# Patient Record
Sex: Male | Born: 1955 | Race: White | Hispanic: No | Marital: Married | Smoking: Current every day smoker
Health system: Southern US, Community
[De-identification: ages and names within clinical notes are randomized; demographics above are authoritative.]

## PROBLEM LIST (undated history)

## (undated) DIAGNOSIS — J449 Chronic obstructive pulmonary disease, unspecified: Secondary | ICD-10-CM

## (undated) DIAGNOSIS — I82409 Acute embolism and thrombosis of unspecified deep veins of unspecified lower extremity: Secondary | ICD-10-CM

## (undated) DIAGNOSIS — G8220 Paraplegia, unspecified: Secondary | ICD-10-CM

## (undated) DIAGNOSIS — I2510 Atherosclerotic heart disease of native coronary artery without angina pectoris: Secondary | ICD-10-CM

## (undated) DIAGNOSIS — J9811 Atelectasis: Principal | ICD-10-CM

## (undated) DIAGNOSIS — I482 Chronic atrial fibrillation, unspecified: Secondary | ICD-10-CM

## (undated) DIAGNOSIS — E119 Type 2 diabetes mellitus without complications: Secondary | ICD-10-CM

## (undated) DIAGNOSIS — I10 Essential (primary) hypertension: Secondary | ICD-10-CM

## (undated) DIAGNOSIS — J9621 Acute and chronic respiratory failure with hypoxia: Secondary | ICD-10-CM

## (undated) HISTORY — PX: TRACHEOSTOMY: SUR1362

## (undated) HISTORY — DX: Chronic atrial fibrillation, unspecified: I48.20

## (undated) HISTORY — DX: Type 2 diabetes mellitus without complications: E11.9

## (undated) HISTORY — DX: Acute and chronic respiratory failure with hypoxia: J96.21

## (undated) HISTORY — PX: PEG PLACEMENT: SHX5437

## (undated) HISTORY — DX: Atherosclerotic heart disease of native coronary artery without angina pectoris: I25.10

## (undated) HISTORY — DX: Chronic obstructive pulmonary disease, unspecified: J44.9

## (undated) HISTORY — DX: Paraplegia, unspecified: G82.20

## (undated) HISTORY — DX: Atelectasis: J98.11

## (undated) HISTORY — DX: Acute embolism and thrombosis of unspecified deep veins of unspecified lower extremity: I82.409

## (undated) HISTORY — DX: Essential (primary) hypertension: I10

---

## 2016-01-16 DEATH — deceased

## 2017-06-07 ENCOUNTER — Other Ambulatory Visit (HOSPITAL_COMMUNITY): Payer: Medicare Other | Admitting: Internal Medicine

## 2017-06-07 ENCOUNTER — Encounter: Payer: Self-pay | Admitting: Internal Medicine

## 2017-06-07 DIAGNOSIS — G8220 Paraplegia, unspecified: Secondary | ICD-10-CM | POA: Insufficient documentation

## 2017-06-07 DIAGNOSIS — J9811 Atelectasis: Secondary | ICD-10-CM | POA: Diagnosis not present

## 2017-06-07 DIAGNOSIS — I82409 Acute embolism and thrombosis of unspecified deep veins of unspecified lower extremity: Secondary | ICD-10-CM

## 2017-06-07 DIAGNOSIS — J449 Chronic obstructive pulmonary disease, unspecified: Secondary | ICD-10-CM | POA: Diagnosis not present

## 2017-06-07 DIAGNOSIS — J4489 Other specified chronic obstructive pulmonary disease: Secondary | ICD-10-CM

## 2017-06-07 DIAGNOSIS — J9621 Acute and chronic respiratory failure with hypoxia: Secondary | ICD-10-CM | POA: Diagnosis not present

## 2017-06-07 DIAGNOSIS — I482 Chronic atrial fibrillation, unspecified: Secondary | ICD-10-CM

## 2017-06-07 HISTORY — DX: Acute and chronic respiratory failure with hypoxia: J96.21

## 2017-06-07 HISTORY — DX: Chronic obstructive pulmonary disease, unspecified: J44.9

## 2017-06-07 HISTORY — DX: Atelectasis: J98.11
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
## 2017-06-08 ENCOUNTER — Other Ambulatory Visit (HOSPITAL_COMMUNITY): Payer: Medicare Other | Admitting: Internal Medicine

## 2017-06-08 DIAGNOSIS — J9811 Atelectasis: Secondary | ICD-10-CM | POA: Diagnosis not present

## 2017-06-08 DIAGNOSIS — I482 Chronic atrial fibrillation, unspecified: Secondary | ICD-10-CM

## 2017-06-08 DIAGNOSIS — J9621 Acute and chronic respiratory failure with hypoxia: Secondary | ICD-10-CM

## 2017-06-08 DIAGNOSIS — G8220 Paraplegia, unspecified: Secondary | ICD-10-CM

## 2017-06-08 DIAGNOSIS — J449 Chronic obstructive pulmonary disease, unspecified: Secondary | ICD-10-CM

## 2017-06-08 DIAGNOSIS — I82409 Acute embolism and thrombosis of unspecified deep veins of unspecified lower extremity: Secondary | ICD-10-CM

## 2017-06-08 DIAGNOSIS — J4489 Other specified chronic obstructive pulmonary disease: Secondary | ICD-10-CM

## 2017-06-08 NOTE — Progress Notes (Signed)
[facility]  PROGRESS NOTE  PULMONARY SERVICE ROUNDS  Date of Service: 06/08/2017  Malik Brooks  DOB: December 02, 1955  Referring physician: Larena Glassman, MD  HPI: Malik Brooks is a 62 y.o. male  being seen for Acute on Chronic Respiratory Failure.  Patient was supposed to have a bronchoscopy done today for right lower lobe atelectasis.  We prepared the patient in the usual manner and had him on 100% FiO2 on the ventilator.  Patient was given 2 mg of Versed and 50 mcg of fentanyl preprocedure.  Patient saturations were adequate prior to beginning the procedure.  The adapter was placed on the tracheostomy for the bronchoscope.  The fiberoptic scope was inserted through the adapter and even before we were reaching the carina patient saturations started to drop so the procedure was immediately aborted.  Patient was bagged and saturations did return.  I was able to catch a quick glimpse of the airway and he did have copious amounts of secretions coming from the left lung.  Unfortunately because of the desaturations we were not able to continue with the procedure.  Review of Systems: Unremarkable other than noted in HPI  Allergies:  Reviewed on the [facility]  Medications: Reviewed  Vitals: Temperature 99 pulse 100 respiratory rate 25 saturations were 98%  Ventilator Settings: Mode of ventilation pressure assist control mode FiO2 100% PEEP 15  Physical Exam: . General:  calm and comfortable NAD . Eyes: normal lids, irises & conjunctiva . ENT: grossly normal tongue not enlarged . Neck: no masses . Cardiovascular: S1 S2 Normal no rubs no gallop . Respiratory: Hoarse breath sounds noted bilaterally . Abdomen: soft non-distended . Skin: no rash seen on limited exam . Musculoskeletal:  no rigidity . Psychiatric: unable to assess . Neurologic: no involuntary movements          Lab Data and radiological Data:  Lab results were reviewed   Assessment/Plan  Patient  Active Problem List   Diagnosis Date Noted  . Acute on chronic respiratory failure with hypoxia (HCC) 06/07/2017  . COPD with chronic bronchitis (HCC) 06/07/2017  . Atelectasis, left 06/07/2017  . Chronic atrial fibrillation (HCC)   . DVT (deep venous thrombosis) (HCC)   . Paraplegia (HCC)       1. Acute on chronic respiratory failure with hypoxia patient has severe desaturations during the procedure so therefore we had to terminate the procedure without actually even really getting started.  The scope was withdrawn immediately patient was bagged and the saturations did recover. 2. Left atelectasis he obviously has mucous plugging is going to need ongoing chest PT Mucomyst possibly a chest vest might help also.  If his saturations do improve we will reconsider doing a bronchoscopy in the future. 3. Chronic atrial fibrillation rate is controlled we will continue to monitor 4. DVT stable at this time 5. Paraplegia we will continue supportive care 6. COPD severe disease we will continue to monitor   I have personally evaluated the patient, evaluated the laboratory and imaging results and formulated the assessment and plan and placed orders as needed. The Patient requires high complexity decision making for assessment and support. I have discussed the patient on rounds with the Respiratory Staff extended time of care due to desaturations time 35 minutes   Yevonne Pax, MD [facility] Pulmonary Critical Care Medicine

## 2017-06-11 ENCOUNTER — Other Ambulatory Visit (HOSPITAL_COMMUNITY): Payer: Medicare Other | Admitting: Internal Medicine

## 2017-06-11 DIAGNOSIS — J9621 Acute and chronic respiratory failure with hypoxia: Secondary | ICD-10-CM | POA: Diagnosis not present

## 2017-06-11 DIAGNOSIS — J449 Chronic obstructive pulmonary disease, unspecified: Secondary | ICD-10-CM

## 2017-06-11 DIAGNOSIS — G8220 Paraplegia, unspecified: Secondary | ICD-10-CM

## 2017-06-11 DIAGNOSIS — I482 Chronic atrial fibrillation, unspecified: Secondary | ICD-10-CM

## 2017-06-11 DIAGNOSIS — J9811 Atelectasis: Secondary | ICD-10-CM

## 2017-06-11 DIAGNOSIS — J4489 Other specified chronic obstructive pulmonary disease: Secondary | ICD-10-CM

## 2017-06-11 DIAGNOSIS — I82409 Acute embolism and thrombosis of unspecified deep veins of unspecified lower extremity: Secondary | ICD-10-CM
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
## 2017-06-12 ENCOUNTER — Other Ambulatory Visit (HOSPITAL_COMMUNITY): Payer: Medicare Other | Admitting: Internal Medicine

## 2017-06-12 DIAGNOSIS — I482 Chronic atrial fibrillation, unspecified: Secondary | ICD-10-CM

## 2017-06-12 DIAGNOSIS — J9621 Acute and chronic respiratory failure with hypoxia: Secondary | ICD-10-CM | POA: Diagnosis not present

## 2017-06-12 DIAGNOSIS — J449 Chronic obstructive pulmonary disease, unspecified: Secondary | ICD-10-CM | POA: Diagnosis not present

## 2017-06-12 DIAGNOSIS — J9811 Atelectasis: Secondary | ICD-10-CM

## 2017-06-12 DIAGNOSIS — G8220 Paraplegia, unspecified: Secondary | ICD-10-CM

## 2017-06-12 DIAGNOSIS — I82409 Acute embolism and thrombosis of unspecified deep veins of unspecified lower extremity: Secondary | ICD-10-CM

## 2017-06-12 NOTE — Progress Notes (Signed)
[facility]  PROGRESS NOTE  PULMONARY SERVICE ROUNDS  Date of Service: 06/12/2017  Malik Brooks  DOB: 18-Nov-1955  Referring physician: Larena Glassman, MD  HPI: Malik Brooks is a 62 y.o. male  being seen for Acute on Chronic Respiratory Failure.  This morning the patient had rapid response patient desaturated and also transiently lost his pulse.  Patient has had issues with oxygenation.  As noted previously he has been having issues with mucus plugging on the left side.  The patient after 1 minute of CPR regained his rhythm as well as a pulse.  Drugs were giving and apparently.  The [facility] team arrived on the scene and because he has been having some issues with mucus plugging and retained secretions we decided to transfer him to the ICU so that he can have a bronchoscopy done under control the environment.  Once this is done and he is brought back we will hopefully be able to continue with the Mucomyst as well as the chest vest to improve his secretion clearance.  Review of Systems: Unremarkable other than noted in HPI  Allergies:  Reviewed on the [facility]  Medications: Reviewed  Vitals: Temperature 97.5 pulse 90 respiratory rate 18 blood pressure 125/70 saturations 94%  Ventilator Settings: Mode of ventilation pressure assist control FiO2 45% tidal volume 390 PEEP of 10  Physical Exam: . General:  calm and comfortable NAD . Eyes: normal lids, irises & conjunctiva . ENT: grossly normal tongue not enlarged . Neck: no masses . Cardiovascular: S1 S2 Normal no rubs no gallop . Respiratory: Coarse breath sounds diminished on the left side . Abdomen: soft non-distended . Skin: no rash seen on limited exam . Musculoskeletal:  no rigidity . Psychiatric: unable to assess . Neurologic: no involuntary movements          Lab Data and radiological Data:  Data has been reviewed   Assessment/Plan  Patient Active Problem List   Diagnosis Date Noted  . Acute  on chronic respiratory failure with hypoxia (HCC) 06/07/2017  . COPD with chronic bronchitis (HCC) 06/07/2017  . Atelectasis, left 06/07/2017  . Chronic atrial fibrillation (HCC)   . DVT (deep venous thrombosis) (HCC)   . Paraplegia (HCC)       1. Acute on chronic respiratory failure with hypoxia at this time we will continue with full vent support.  Patient will be transferred to the ICU so that we can get his bronchoscopy done and then he will back to the floor after this is done.  He needs aggressive pulmonary toilet and will continue with Mucomyst and also once he is back we will add the chest vest to his regimen in addition to the in line she has secretion clearance device. 2. COPD severe disease we will continue with present management. 3. Left lower atelectasis continue aggressive pulmonary toilet supportive care 4. Chronic atrial fibrillation rate is controlled we will continue to monitor. 5. DVT stable at this time 6. Paraplegic continue present management.   I have personally evaluated the patient, evaluated the laboratory and imaging results and formulated the assessment and plan and placed orders as needed. The Patient requires high complexity decision making for assessment and support. I have discussed the patient on rounds with the Respiratory Staff time spent 35 minutes including review of data discussion with the ICU team and medical staff coordination of care   Yevonne Pax, MD [facility] Pulmonary Critical Care Medicine

## 2017-06-23 ENCOUNTER — Other Ambulatory Visit (HOSPITAL_COMMUNITY): Payer: Medicare Other | Admitting: Internal Medicine

## 2017-06-23 DIAGNOSIS — J449 Chronic obstructive pulmonary disease, unspecified: Secondary | ICD-10-CM | POA: Diagnosis not present

## 2017-06-23 DIAGNOSIS — I482 Chronic atrial fibrillation, unspecified: Secondary | ICD-10-CM

## 2017-06-23 DIAGNOSIS — J9621 Acute and chronic respiratory failure with hypoxia: Secondary | ICD-10-CM | POA: Diagnosis not present

## 2017-06-23 DIAGNOSIS — J9811 Atelectasis: Secondary | ICD-10-CM

## 2017-06-23 DIAGNOSIS — J4489 Other specified chronic obstructive pulmonary disease: Secondary | ICD-10-CM

## 2017-06-23 DIAGNOSIS — G8220 Paraplegia, unspecified: Secondary | ICD-10-CM

## 2017-06-23 DIAGNOSIS — I82409 Acute embolism and thrombosis of unspecified deep veins of unspecified lower extremity: Secondary | ICD-10-CM
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
## 2017-06-25 ENCOUNTER — Other Ambulatory Visit (HOSPITAL_COMMUNITY): Payer: Medicare Other | Admitting: Internal Medicine

## 2017-06-25 DIAGNOSIS — J9621 Acute and chronic respiratory failure with hypoxia: Secondary | ICD-10-CM | POA: Diagnosis not present

## 2017-06-25 DIAGNOSIS — J449 Chronic obstructive pulmonary disease, unspecified: Secondary | ICD-10-CM

## 2017-06-25 DIAGNOSIS — I482 Chronic atrial fibrillation, unspecified: Secondary | ICD-10-CM

## 2017-06-25 DIAGNOSIS — J9811 Atelectasis: Secondary | ICD-10-CM

## 2017-06-25 DIAGNOSIS — J4489 Other specified chronic obstructive pulmonary disease: Secondary | ICD-10-CM

## 2017-06-25 DIAGNOSIS — I82409 Acute embolism and thrombosis of unspecified deep veins of unspecified lower extremity: Secondary | ICD-10-CM

## 2017-06-25 DIAGNOSIS — G8220 Paraplegia, unspecified: Secondary | ICD-10-CM

## 2017-06-25 NOTE — Progress Notes (Signed)
[facility]  PROGRESS NOTE  PULMONARY SERVICE ROUNDS  Date of Service: 06/25/2017  Malik Brooks  DOB: 09/06/1955  Referring physician: Larena GlassmanAmir Firozvi, MD  HPI: Malik Brooks is a 62 y.o. male  being seen for Acute on Chronic Respiratory Failure.  He is on the ventilator and full support has not been tolerating pressure control mode.  Right now is on 45% oxygen.  Review of Systems: Unremarkable other than noted in HPI  Allergies:  [facility]  Medications: Reviewed  Vitals: Temperature 98.3 pulse 91 respiratory rate 18 blood pressure 130/74 saturations 93%  Ventilator Settings: Mode of ventilation pressure assist control FiO2 45% tidal volume 596 PEEP 12  Physical Exam: . General:  calm and comfortable NAD . Eyes: normal lids, irises & conjunctiva . ENT: grossly normal tongue not enlarged . Neck: no masses . Cardiovascular: S1 S2 Normal no rubs no gallop . Respiratory: No rhonchi are noted at this time. . Abdomen: soft non-distended . Skin: no rash seen on limited exam . Musculoskeletal:  no rigidity . Psychiatric: unable to assess . Neurologic: no involuntary movements          Lab Data and radiological Data:  Sodium 140 potassium 3.9 BUN 19 creatinine 0.3 White count 13.7 hemoglobin 9.4 hematocrit 31.5 platelet count 257   Assessment/Plan  Patient Active Problem List   Diagnosis Date Noted  . Acute on chronic respiratory failure with hypoxia (HCC) 06/07/2017  . COPD with chronic bronchitis (HCC) 06/07/2017  . Atelectasis, left 06/07/2017  . Chronic atrial fibrillation (HCC)   . DVT (deep venous thrombosis) (HCC)   . Paraplegia (HCC)       1. Acute on chronic respiratory failure with hypoxia remaining on full vent support currently is on pressure assist control mode patient's been requiring 45% oxygen with PEEP of 12.  Continue with aggressive pulmonary toilet supportive care. 2. COPD with chronic bronchitis advanced  disease we will continue to follow. 3. Chronic atrial fibrillation rate is controlled we will continue to follow 4. DVT at baseline 5. Paraplegia's for now 6. Atelectasis we will continue with aggressive pulmonary toilet and follow   I have personally evaluated the patient, evaluated the laboratory and imaging results and formulated the assessment and plan and placed orders as needed. The Patient requires high complexity decision making for assessment and support. I have discussed the patient on rounds with the Respiratory Staff   Yevonne PaxSaadat A Hlee Fringer, MD [facility] Pulmonary Critical Care Medicine

## 2017-06-26 ENCOUNTER — Other Ambulatory Visit (HOSPITAL_COMMUNITY): Payer: Medicare Other | Admitting: Internal Medicine

## 2017-06-26 DIAGNOSIS — J9811 Atelectasis: Secondary | ICD-10-CM | POA: Diagnosis not present

## 2017-06-26 DIAGNOSIS — J9621 Acute and chronic respiratory failure with hypoxia: Secondary | ICD-10-CM | POA: Diagnosis not present

## 2017-06-26 DIAGNOSIS — I482 Chronic atrial fibrillation, unspecified: Secondary | ICD-10-CM

## 2017-06-26 DIAGNOSIS — G8220 Paraplegia, unspecified: Secondary | ICD-10-CM

## 2017-06-26 DIAGNOSIS — I82409 Acute embolism and thrombosis of unspecified deep veins of unspecified lower extremity: Secondary | ICD-10-CM

## 2017-06-26 DIAGNOSIS — J449 Chronic obstructive pulmonary disease, unspecified: Secondary | ICD-10-CM | POA: Diagnosis not present

## 2017-06-26 DIAGNOSIS — J4489 Other specified chronic obstructive pulmonary disease: Secondary | ICD-10-CM

## 2017-06-26 NOTE — Progress Notes (Signed)
[facility]  PROGRESS NOTE  PULMONARY SERVICE ROUNDS  Date of Service: 06/26/2017  Malik Brooks  DOB: 07/23/1955  Referring physician: Larena GlassmanAmir Firozvi, MD  HPI: Malik Brooks is a 62 y.o. male  being seen for Acute on Chronic Respiratory Failure.  Patient remains on the ventilator.  Still having copious amounts of secretions.  Has not been able to come down on the oxygen has also not been able to come down on the PEEP currently is on PEEP of 12.  Apparently the family wants to take him home on the ventilator.  We discussed this on rounds to see if this will be even feasible.  At this time with his current ventilator requirements it may not be feasible  Review of Systems: Unremarkable other than noted in HPI  Allergies:  [facility]  Medications: Reviewed  Vitals: Temperature is 97.2 pulse 84 respiratory rate 18 blood pressure 126/64 saturations 93%  Ventilator Settings: Currently is on pressure assist control mode FiO2 50% tidal volume 510 PEEP of 12  Physical Exam: . General:  calm and comfortable NAD . Eyes: normal lids, irises & conjunctiva . ENT: grossly normal tongue not enlarged . Neck: no masses . Cardiovascular: S1 S2 Normal no rubs no gallop . Respiratory: Coarse breath sounds are noted at this time. . Abdomen: soft non-distended . Skin: no rash seen on limited exam . Musculoskeletal:  no rigidity . Psychiatric: unable to assess . Neurologic: no involuntary movements          Lab Data and radiological Data:  Data reviewed   Assessment/Plan  Patient Active Problem List   Diagnosis Date Noted  . Acute on chronic respiratory failure with hypoxia (HCC) 06/07/2017  . COPD with chronic bronchitis (HCC) 06/07/2017  . Atelectasis, left 06/07/2017  . Chronic atrial fibrillation (HCC)   . DVT (deep venous thrombosis) (HCC)   . Paraplegia (HCC)       1. Acute on chronic respiratory failure with hypoxia we will continue with  full vent support.  Continue with aggressive pulmonary toilet.  Patient's oxygen requirements are still significantly elevated we will try to wean the 2. Atelectasis continue aggressive pulmonary toilet 3. Chronic atrial fibrillation rate is controlled we will follow 4. DVT stable continue to monitor 5. Paraplegia functional quadriplegic we will continue supportive care prognosis guarded 6. COPD stable we will monitor   I have personally evaluated the patient, evaluated the laboratory and imaging results and formulated the assessment and plan and placed orders as needed. The Patient requires high complexity decision making for assessment and support. I have discussed the patient on rounds with the Respiratory Staff   Yevonne PaxSaadat A Khan, MD [facility] Pulmonary Critical Care Medicine

## 2017-06-28 ENCOUNTER — Other Ambulatory Visit (HOSPITAL_COMMUNITY): Payer: Medicare Other | Admitting: Internal Medicine

## 2017-06-28 DIAGNOSIS — J449 Chronic obstructive pulmonary disease, unspecified: Secondary | ICD-10-CM

## 2017-06-28 DIAGNOSIS — J9811 Atelectasis: Secondary | ICD-10-CM

## 2017-06-28 DIAGNOSIS — J9621 Acute and chronic respiratory failure with hypoxia: Secondary | ICD-10-CM

## 2017-06-28 DIAGNOSIS — I482 Chronic atrial fibrillation, unspecified: Secondary | ICD-10-CM

## 2017-06-28 DIAGNOSIS — I82409 Acute embolism and thrombosis of unspecified deep veins of unspecified lower extremity: Secondary | ICD-10-CM

## 2017-06-28 DIAGNOSIS — G8220 Paraplegia, unspecified: Secondary | ICD-10-CM

## 2017-06-28 NOTE — Progress Notes (Signed)
[facility]  PROGRESS NOTE  PULMONARY SERVICE ROUNDS  Date of Service: 06/28/2017  Malik Brooks  DOB: 01/08/1956  Referring physician: Larena GlassmanAmir Firozvi, MD  HPI: Malik DecentRay Thomas Brooks is a 62 y.o. male  being seen for Acute on Chronic Respiratory Failure.  Patient remains on the ventilator full support not able to wean.  He is awake and alert comfortable without distress.  Currently is on pressure assist control mode  Review of Systems: Unremarkable other than noted in HPI  Allergies:  Reviewed on the [facility]MAR  Medications: Reviewed  Vitals: Temperature 96.8 pulse 78 respiratory rate 18 blood pressure 116/74 saturations 90  Ventilator Settings: Mode of ventilation pressure assist control FiO2 50% tidal volume 737 PEEP 12  Physical Exam: . General:  calm and comfortable NAD . Eyes: normal lids, irises & conjunctiva . ENT: grossly normal tongue not enlarged . Neck: no masses . Cardiovascular: S1 S2 Normal no rubs no gallop . Respiratory: No rhonchi or rales . Abdomen: soft non-distended . Skin: no rash seen on limited exam . Musculoskeletal:  no rigidity . Psychiatric: unable to assess . Neurologic: no involuntary movements          Lab Data and radiological Data:  White count 8.8 hemoglobin 9 hematocrit 29.7 platelet count 261   Assessment/Plan  Patient Active Problem List   Diagnosis Date Noted  . Acute on chronic respiratory failure with hypoxia (HCC) 06/07/2017  . COPD with chronic bronchitis (HCC) 06/07/2017  . Atelectasis, left 06/07/2017  . Chronic atrial fibrillation (HCC)   . DVT (deep venous thrombosis) (HCC)   . Paraplegia (HCC)       1. Acute on chronic respiratory failure with hypoxia we will continue with pressure assist control mode continue pulmonary toilet supportive care patient is on 50% try to titrate down afebrile able to tolerate. 2. COPD severe disease we will continue with present management. 3. Chronic atrial fibrillation  rate is controlled. 4. Atelectasis chronic we will continue aggressive pulmonary toilet follow x-rays as necessary 5. DVT stable 6. Paraplegia functional quadriplegia we will continue with supportive care   I have personally evaluated the patient, evaluated the laboratory and imaging results and formulated the assessment and plan and placed orders as needed. The Patient requires high complexity decision making for assessment and support. I have discussed the patient on rounds with the Respiratory Staff   Yevonne PaxSaadat A Derika Eckles, MD [facility] Pulmonary Critical Care Medicine

## 2017-07-09 ENCOUNTER — Other Ambulatory Visit (HOSPITAL_COMMUNITY): Payer: Medicare Other | Admitting: Internal Medicine

## 2017-07-09 DIAGNOSIS — I482 Chronic atrial fibrillation, unspecified: Secondary | ICD-10-CM

## 2017-07-09 DIAGNOSIS — J449 Chronic obstructive pulmonary disease, unspecified: Secondary | ICD-10-CM | POA: Diagnosis not present

## 2017-07-09 DIAGNOSIS — J9811 Atelectasis: Secondary | ICD-10-CM

## 2017-07-09 DIAGNOSIS — J9621 Acute and chronic respiratory failure with hypoxia: Secondary | ICD-10-CM

## 2017-07-09 DIAGNOSIS — G8220 Paraplegia, unspecified: Secondary | ICD-10-CM

## 2017-07-09 NOTE — Progress Notes (Signed)
[facility]  PROGRESS NOTE  PULMONARY SERVICE ROUNDS  Date of Service: 07/09/2017  Malik Brooks  DOB: 12/18/1955  Referring physician: Larena GlassmanAmir Firozvi, MD  HPI: Malik Brooks is a 62 y.o. male  being seen for Acute on Chronic Respiratory Failure.  Patient is on pressure support has been tolerating it well apparently has been doing pressure support for 24 hours now  Review of Systems: Unremarkable other than noted in HPI  Allergies:  [facility]  Medications: Reviewed  Vitals: Temperature 97.6 pulse 107 respiratory rate 23 blood pressure 136/78 saturations 96%  Ventilator Settings: Mode of ventilation pressure support FiO2 45% tidal volume 477 pressure support 12 PEEP 8  Physical Exam: . General:  calm and comfortable NAD . Eyes: normal lids, irises & conjunctiva . ENT: grossly normal tongue not enlarged . Neck: no masses . Cardiovascular: S1 S2 Normal no rubs no gallop . Respiratory: No rhonchi or rales are noted at this time . Abdomen: soft non-distended . Skin: no rash seen on limited exam . Musculoskeletal:  no rigidity . Psychiatric: unable to assess . Neurologic: no involuntary movements          Lab Data and radiological Data:  Data has been reviewed   Assessment/Plan  Patient Active Problem List   Diagnosis Date Noted  . Acute on chronic respiratory failure with hypoxia (HCC) 06/07/2017  . COPD with chronic bronchitis (HCC) 06/07/2017  . Atelectasis, left 06/07/2017  . Chronic atrial fibrillation (HCC)   . DVT (deep venous thrombosis) (HCC)   . Paraplegia (HCC)       1. Acute on chronic respiratory failure with hypoxia patient is doing fine with weaning on pressure support spoke with respiratory therapy on rounds and will try the patient on T collar if able to tolerate. 2. COPD with chronic bronchitis stable at this time we will continue present management. 3. Atelectasis follow x-rays as necessary 4. Chronic atrial  fibrillation rate is controlled 5. Paraplegia at baseline we will continue to monitor   I have personally evaluated the patient, evaluated the laboratory and imaging results and formulated the assessment and plan and placed orders as needed. The Patient requires high complexity decision making for assessment and support. I have discussed the patient on rounds with the Respiratory Staff   Yevonne PaxSaadat A Khan, MD [facility] Pulmonary Critical Care Medicine

## 2017-07-10 ENCOUNTER — Other Ambulatory Visit (HOSPITAL_COMMUNITY): Payer: Medicare Other | Admitting: Internal Medicine

## 2017-07-10 DIAGNOSIS — J9811 Atelectasis: Secondary | ICD-10-CM

## 2017-07-10 DIAGNOSIS — J4489 Other specified chronic obstructive pulmonary disease: Secondary | ICD-10-CM

## 2017-07-10 DIAGNOSIS — J9621 Acute and chronic respiratory failure with hypoxia: Secondary | ICD-10-CM | POA: Diagnosis not present

## 2017-07-10 DIAGNOSIS — J449 Chronic obstructive pulmonary disease, unspecified: Secondary | ICD-10-CM

## 2017-07-10 DIAGNOSIS — I482 Chronic atrial fibrillation, unspecified: Secondary | ICD-10-CM

## 2017-07-10 DIAGNOSIS — G8220 Paraplegia, unspecified: Secondary | ICD-10-CM

## 2017-07-10 NOTE — Progress Notes (Signed)
[facility]  [facility]GRESS NOTE  PULMONARY SERVICE ROUNDS  Date of Service: 07/10/2017  Malik Brooks  DOB: 07/08/1955  Referring physician: Larena GlassmanAmir Firozvi, MD  HPI: Malik Brooks is a 62 y.o. male  being seen for Acute on Chronic Respiratory Failure.  Patient remains on full vent support has been on pressure assist control mode currently on 45% oxygen good saturations are noted.  Good volumes are noted.  Patient's not really able to tolerate being off the ventilator for prolonged period of time  Review of Systems: Unremarkable other than noted in HPI  Allergies:  Reviewed on the [facility]  Medications: Reviewed  Vitals: Temperature 97.8 pulse 123 respiratory rate 21 blood pressure 103/56 saturations 97%  Ventilator Settings: Mode of ventilation pressure assist control FiO2 45% tidal volume 712 PEEP 8  Physical Exam: . General:  calm and comfortable NAD . Eyes: normal lids, irises & conjunctiva . ENT: grossly normal tongue not enlarged . Neck: no masses . Cardiovascular: S1 S2 Normal no rubs no gallop . Respiratory: No rhonchi or rales are noted . Abdomen: soft non-distended . Skin: no rash seen on limited exam . Musculoskeletal:  no rigidity . Psychiatric: unable to assess . Neurologic: no involuntary movements          Lab Data and radiological Data:  Sodium 139 potassium 3.5 BUN 15 creatinine 0.5 White count 13.6 hemoglobin 8.6 hematocrit 28.9 platelet count 269   Assessment/Plan  Patient Active Problem List   Diagnosis Date Noted  . Acute on chronic respiratory failure with hypoxia (HCC) 06/07/2017  . COPD with chronic bronchitis (HCC) 06/07/2017  . Atelectasis, left 06/07/2017  . Chronic atrial fibrillation (HCC)   . DVT (deep venous thrombosis) (HCC)   . Paraplegia (HCC)       1. Acute on chronic respiratory failure with hypoxia we will continue with attempting weaning as tolerated.  Patient will be followed by weaning protocol continue  secretion management pulmonary toilet overall prognosis is guarded for full wean he will need ongoing ventilatory support. 2. COPD with chronic bronchitis we will continue present management. 3. Left sided atelectasis continue aggressive pulmonary toilet 4. Chronic atrial fibrillation rate is controlled we will follow 5. Paraplegia at baseline limiting factor as far as being able to wean   I have personally evaluated the patient, evaluated the laboratory and imaging results and formulated the assessment and plan and placed orders as needed. The Patient requires high complexity decision making for assessment and support. I have discussed the patient on rounds with the Respiratory Staff   Yevonne PaxSaadat A Khan, MD [facility] Pulmonary Critical Care Medicine

## 2017-07-14 ENCOUNTER — Other Ambulatory Visit (HOSPITAL_COMMUNITY): Payer: Medicare Other | Admitting: Internal Medicine

## 2017-07-14 DIAGNOSIS — J9811 Atelectasis: Secondary | ICD-10-CM | POA: Diagnosis not present

## 2017-07-14 DIAGNOSIS — J9621 Acute and chronic respiratory failure with hypoxia: Secondary | ICD-10-CM

## 2017-07-14 DIAGNOSIS — G8220 Paraplegia, unspecified: Secondary | ICD-10-CM

## 2017-07-14 DIAGNOSIS — I482 Chronic atrial fibrillation, unspecified: Secondary | ICD-10-CM

## 2017-07-14 DIAGNOSIS — J449 Chronic obstructive pulmonary disease, unspecified: Secondary | ICD-10-CM

## 2017-07-14 DIAGNOSIS — I82409 Acute embolism and thrombosis of unspecified deep veins of unspecified lower extremity: Secondary | ICD-10-CM

## 2017-07-14 NOTE — Progress Notes (Signed)
[facility]  [facility]GRESS NOTE  PULMONARY SERVICE ROUNDS  Date of Service: 07/14/2017  Malik Brooks Thomas Stiehl  DOB: 05/13/1955  Referring physician: Larena GlassmanAmir Firozvi, MD  HPI: Malik DecentRay Thomas Brooks is a 62 y.o. male  being seen for Acute on Chronic Respiratory Failure.  Patient is on full vent support remains currently on pressure assist control has been on 65% oxygen no distress.  Review of Systems: Unremarkable other than noted in HPI  Allergies:  Reviewed on the [facility]  Medications: Reviewed  Vitals: Temperature is 98.7 pulse 99 respiratory rate 30 blood pressure 130/70 saturations are 99%  Ventilator Settings: Mode of ventilation pressure assist control FiO2 65% tidal volume 710 PEEP 10  Physical Exam: . General:  calm and comfortable NAD . Eyes: normal lids, irises & conjunctiva . ENT: grossly normal tongue not enlarged . Neck: no masses . Cardiovascular: S1 S2 Normal no rubs no gallop . Respiratory: No rhonchi or rales are noted at this time . Abdomen: soft non-distended . Skin: no rash seen on limited exam . Musculoskeletal:  no rigidity . Psychiatric: unable to assess . Neurologic: no involuntary movements          Lab Data and radiological Data:  Data has been reviewed   Assessment/Plan  Patient Active Problem List   Diagnosis Date Noted  . Acute on chronic respiratory failure with hypoxia (HCC) 06/07/2017  . COPD with chronic bronchitis (HCC) 06/07/2017  . Atelectasis, left 06/07/2017  . Chronic atrial fibrillation (HCC)   . DVT (deep venous thrombosis) (HCC)   . Paraplegia (HCC)       1. Acute on chronic respiratory failure with hypoxia we will continue with pressure assist control continue pulmonary toilet secretion management patient's prognosis overall remains guarded. 2. COPD with chronic bronchitis stable at this time we will continue to follow 3. Left-sided atelectasis continue present therapy 4. Chronic atrial fibrillation rate is  controlled 5. DVT treated 6. Paraplegia stable we will monitor   I have personally evaluated the patient, evaluated the laboratory and imaging results and formulated the assessment and plan and placed orders as needed. The Patient requires high complexity decision making for assessment and support. I have discussed the patient on rounds with the Respiratory Staff   Yevonne PaxSaadat A Felissa Blouch, MD [facility] Pulmonary Critical Care Medicine

## 2017-07-15 ENCOUNTER — Other Ambulatory Visit (HOSPITAL_COMMUNITY): Payer: Medicare Other | Admitting: Internal Medicine

## 2017-07-15 DIAGNOSIS — I482 Chronic atrial fibrillation, unspecified: Secondary | ICD-10-CM

## 2017-07-15 DIAGNOSIS — J9621 Acute and chronic respiratory failure with hypoxia: Secondary | ICD-10-CM | POA: Diagnosis not present

## 2017-07-15 DIAGNOSIS — J9811 Atelectasis: Secondary | ICD-10-CM | POA: Diagnosis not present

## 2017-07-15 DIAGNOSIS — J449 Chronic obstructive pulmonary disease, unspecified: Secondary | ICD-10-CM

## 2017-07-15 DIAGNOSIS — G8220 Paraplegia, unspecified: Secondary | ICD-10-CM

## 2017-07-15 DIAGNOSIS — I82409 Acute embolism and thrombosis of unspecified deep veins of unspecified lower extremity: Secondary | ICD-10-CM

## 2017-07-15 NOTE — Progress Notes (Signed)
[facility]  [facility]GRESS NOTE  PULMONARY SERVICE ROUNDS  Date of Service: 07/15/2017  Malik Brooks Thomas Bonelli  DOB: 11/26/1955  Referring physician: Larena GlassmanAmir Firozvi, MD  HPI: Malik DecentRay Thomas Brooks is a 62 y.o. male  being seen for Acute on Chronic Respiratory Failure.  Patient has been doing poorly as far as being able to wean off the ventilator.  As noted previously I do not think he is going to be able to come off the ventilator.  Right now is on full support and pressure assist control mode has been on 65% oxygen with a PEEP of 10  Review of Systems: Unremarkable other than noted in HPI  Allergies:  [facility]  Medications: Reviewed  Vitals: Temperature 98.5 pulse 96 respiratory rate 17 blood pressure 120/80 saturations 96%  Ventilator Settings: Mode of ventilation pressure assist control mode 65% FiO2 PEEP of 10 tidal volume 491  Physical Exam: . General:  calm and comfortable NAD . Eyes: normal lids, irises & conjunctiva . ENT: grossly normal tongue not enlarged . Neck: no masses . Cardiovascular: S1 S2 Normal no rubs no gallop . Respiratory: Coarse breath sounds are noted bilaterally . Abdomen: soft non-distended . Skin: no rash seen on limited exam . Musculoskeletal:  no rigidity . Psychiatric: unable to assess . Neurologic: no involuntary movements          Lab Data and radiological Data:  Data has been reviewed   Assessment/Plan  Patient Active Problem List   Diagnosis Date Noted  . Acute on chronic respiratory failure with hypoxia (HCC) 06/07/2017  . COPD with chronic bronchitis (HCC) 06/07/2017  . Atelectasis, left 06/07/2017  . Chronic atrial fibrillation (HCC)   . DVT (deep venous thrombosis) (HCC)   . Paraplegia (HCC)       1. Acute on chronic respiratory failure with hypoxia we will continue with full support on pressure control as noted above patient is not going to be able to wean.  Discussed with case management I think he is  going to need placement and a skilled nursing facility for ventilators. 2. COPD with chronic bronchitis we will continue with supportive care at baseline 3. Left-sided atelectasis needs aggressive pulmonary toilet discussed with respiratory therapy the possibility of resuming the chest vest for him. 4. Chronic atrial fibrillation rate is controlled 5. DVT treated 6. Paraplegia limiting factor as far as being able to wean   I have personally evaluated the patient, evaluated the laboratory and imaging results and formulated the assessment and plan and placed orders as needed. The Patient requires high complexity decision making for assessment and support. I have discussed the patient on rounds with the Respiratory Staff   Yevonne PaxSaadat A Khan, MD [facility] Pulmonary Critical Care Medicine

## 2017-07-16 ENCOUNTER — Other Ambulatory Visit (HOSPITAL_COMMUNITY): Payer: Medicare Other | Admitting: Internal Medicine

## 2017-07-16 DIAGNOSIS — I482 Chronic atrial fibrillation, unspecified: Secondary | ICD-10-CM

## 2017-07-16 DIAGNOSIS — J9811 Atelectasis: Secondary | ICD-10-CM

## 2017-07-16 DIAGNOSIS — J449 Chronic obstructive pulmonary disease, unspecified: Secondary | ICD-10-CM | POA: Diagnosis not present

## 2017-07-16 DIAGNOSIS — J9621 Acute and chronic respiratory failure with hypoxia: Secondary | ICD-10-CM | POA: Diagnosis not present

## 2017-07-16 DIAGNOSIS — G8220 Paraplegia, unspecified: Secondary | ICD-10-CM

## 2017-07-16 NOTE — Progress Notes (Signed)
[facility]  [facility]ESS [facility]  PULMONARY SERVICE ROUNDS  Date of Service: 07/16/2017  Malik Brooks  DOB: 12/21/1955  Referring physician: Larena GlassmanAmir Firozvi, MD  HPI: Malik Brooks is a 62 y.o. male  being seen for Acute on Chronic Respiratory Failure.  Patient remains on the ventilator full support unable to be needed.  Continues with pressure assist control currently is on 65% FiO2.  Review of Systems: Unremarkable other than noted in HPI  Allergies:  [facility] the [facility]  Medications: Reviewed  Vitals: Temperature 96.3 pulse 70 respiratory 27 blood pressure is 122/58 saturations 95%  Ventilator Settings: Mode of ventilation pressure assist control FiO2 65% tidal volume 597 PEEP 8  Physical Exam: . General:  calm and comfortable NAD . Eyes: normal lids, irises & conjunctiva . ENT: grossly normal tongue not enlarged . Neck: no masses . Cardiovascular: S1 S2 Normal no rubs no gallop . Respiratory: Coarse breath sounds no rhonchi . Abdomen: soft non-distended . Skin: no rash seen on limited exam . Musculoskeletal:  no rigidity . Psychiatric: unable to assess . Neurologic: no involuntary movements          Lab Data and radiological Data:  Data has been reviewed   Assessment/Plan  Patient Active Problem List   Diagnosis Date Noted  . Acute on chronic respiratory failure with hypoxia (HCC) 06/07/2017  . COPD with chronic bronchitis (HCC) 06/07/2017  . Atelectasis, left 06/07/2017  . Chronic atrial fibrillation (HCC)   . DVT (deep venous thrombosis) (HCC)   . Paraplegia (HCC)       1. Acute on chronic respiratory failure with hypoxia we will continue full support on pressure assist control currently is comfortable without distress at this time.  Patient is not able to wean. 2. COPD with chronic bronchitis we will continue supportive care he has advanced disease. 3. Atelectasis continue aggressive pulmonary toilet supportive care 4. Chronic  atrial fibrillation rate is controlled 5. DVT stable we will continue to follow 6. Paraplegia limiting factor for weaning on this patient   I have personally evaluated the patient, evaluated the laboratory and imaging results and formulated the assessment and plan and placed orders as needed. The Patient requires high complexity decision making for assessment and support. I have discussed the patient on rounds with the Respiratory Staff   Yevonne PaxSaadat A Linwood Gullikson, MD [facility] Pulmonary Critical Care Medicine

## 2017-07-17 ENCOUNTER — Other Ambulatory Visit (HOSPITAL_COMMUNITY): Payer: Medicare Other | Admitting: Internal Medicine

## 2017-07-17 DIAGNOSIS — J449 Chronic obstructive pulmonary disease, unspecified: Secondary | ICD-10-CM | POA: Diagnosis not present

## 2017-07-17 DIAGNOSIS — J9811 Atelectasis: Secondary | ICD-10-CM | POA: Diagnosis not present

## 2017-07-17 DIAGNOSIS — J9621 Acute and chronic respiratory failure with hypoxia: Secondary | ICD-10-CM

## 2017-07-17 DIAGNOSIS — I82409 Acute embolism and thrombosis of unspecified deep veins of unspecified lower extremity: Secondary | ICD-10-CM

## 2017-07-17 DIAGNOSIS — I482 Chronic atrial fibrillation, unspecified: Secondary | ICD-10-CM

## 2017-07-17 DIAGNOSIS — G8220 Paraplegia, unspecified: Secondary | ICD-10-CM

## 2017-07-17 NOTE — Progress Notes (Signed)
[facility]  PROGRESS NOTE  PULMONARY SERVICE ROUNDS  Date of Service: 07/17/2017  Malik Decentay Thomas Brooks  DOB: 10/03/1955  Referring physician: Larena GlassmanAmir Firozvi, MD  HPI: Malik Brooks is a 62 y.o. male  being seen for Acute on Chronic Respiratory Failure.  Patient remains on the ventilator at baseline currently is on 60% oxygen for low sats noted  Review of Systems: Unremarkable other than noted in HPI  Allergies:  [facility] on the [facility]  Medications: Reviewed  Vitals: Temperature 97.9 pulse 93 respiratory 16 blood pressure 120/62 saturation 96%  Ventilator Settings: Mode of ventilation pressure assist control FiO2 60% tidal volume 673 PEEP 8  Physical Exam: . General:  calm and comfortable NAD . Eyes: normal lids, irises & conjunctiva . ENT: grossly normal tongue not enlarged . Neck: no masses . Cardiovascular: S1 S2 Normal no rubs no gallop . Respiratory: No rhonchi or rales noted . Abdomen: soft non-distended . Skin: no rash seen on limited exam . Musculoskeletal:  no rigidity . Psychiatric: unable to assess . Neurologic: no involuntary movements          Lab Data and radiological Data:  Data has been reviewed   Assessment/Plan  Patient Active Problem List   Diagnosis Date Noted  . Acute on chronic respiratory failure with hypoxia (HCC) 06/07/2017  . COPD with chronic bronchitis (HCC) 06/07/2017  . Atelectasis, left 06/07/2017  . Chronic atrial fibrillation (HCC)   . DVT (deep venous thrombosis) (HCC)   . Paraplegia (HCC)       1. Acute on chronic respiratory failure with hypoxia continue with full vent support patient is not amenable.  Try to wean FiO2 down if possible. 2. COPD severe disease we will continue present management. 3. Atelectasis continue with pulmonary toilet 4. Chronic atrial fibrillation rate is controlled 5. DVT treated 6. Paraplegia stable   I have personally evaluated the patient, evaluated the laboratory and  imaging results and formulated the assessment and plan and placed orders as needed. The Patient requires high complexity decision making for assessment and support. I have discussed the patient on rounds with the Respiratory Staff   Yevonne PaxSaadat A Laramie Gelles, MD [facility] Pulmonary Critical Care Medicine

## 2017-07-23 ENCOUNTER — Other Ambulatory Visit (HOSPITAL_COMMUNITY): Payer: Medicare Other | Admitting: Internal Medicine

## 2017-07-23 DIAGNOSIS — J9621 Acute and chronic respiratory failure with hypoxia: Secondary | ICD-10-CM

## 2017-07-23 DIAGNOSIS — I482 Chronic atrial fibrillation, unspecified: Secondary | ICD-10-CM

## 2017-07-23 DIAGNOSIS — I82409 Acute embolism and thrombosis of unspecified deep veins of unspecified lower extremity: Secondary | ICD-10-CM

## 2017-07-23 DIAGNOSIS — G8220 Paraplegia, unspecified: Secondary | ICD-10-CM

## 2017-07-23 DIAGNOSIS — J449 Chronic obstructive pulmonary disease, unspecified: Secondary | ICD-10-CM | POA: Diagnosis not present

## 2017-07-23 DIAGNOSIS — J9811 Atelectasis: Secondary | ICD-10-CM | POA: Diagnosis not present

## 2017-07-23 NOTE — Progress Notes (Signed)
[facility]  PROGRESS NOTE  PULMONARY SERVICE ROUNDS  Date of Service: 07/23/2017  Malik Brooks  DOB: 01/12/1956  Referring physician: Larena GlassmanAmir Firozvi, MD  HPI: Malik Brooks is a 62 y.o. male  being seen for Acute on Chronic Respiratory Failure.  Patient is on T collar did about 12 hours yesterday off the ventilator  Review of Systems: Unremarkable other than noted in HPI  Allergies:  [facility] the [facility]  Medications: Reviewed  Vitals: Temperature 98.0 pulse 108 respiratory rate 20 blood pressure 120/70 saturation 94%  Ventilator Settings: Off the ventilator on T collar  Physical Exam: . General:  calm and comfortable NAD . Eyes: normal lids, irises & conjunctiva . ENT: grossly normal tongue not enlarged . Neck: no masses . Cardiovascular: S1 S2 Normal no rubs no gallop . Respiratory: No rhonchi or rales . Abdomen: soft non-distended . Skin: no rash seen on limited exam . Musculoskeletal:  no rigidity . Psychiatric: unable to assess . Neurologic: no involuntary movements          Lab Data and radiological Data:  Sodium 136 potassium 3.4 BUN 18 creatinine 0.3 White count 11.2 hemoglobin 9.8 platelet count 289   Assessment/Plan  Patient Active Problem List   Diagnosis Date Noted  . Acute on chronic respiratory failure with hypoxia (HCC) 06/07/2017  . COPD with chronic bronchitis (HCC) 06/07/2017  . Atelectasis, left 06/07/2017  . Chronic atrial fibrillation (HCC)   . DVT (deep venous thrombosis) (HCC)   . Paraplegia (HCC)       1. Acute on chronic respiratory failure with hypoxia patient is back off the ventilator think it best to be doing about 12 hours off the ventilator possibly extended to 18 hours.  He seems to still fatigued fairly easily we will continue with supportive care 2. COPD severe disease we will continue present management. 3. Left-sided atelectasis continue pulmonary toilet secretion management 4. Atrial  fibrillation rate controlled 5. DVT treated we will monitor 6. Paraplegia at baseline   I have personally evaluated the patient, evaluated the laboratory and imaging results and formulated the assessment and plan and placed orders as needed. The Patient requires high complexity decision making for assessment and support. I have discussed the patient on rounds with the Respiratory Staff   Yevonne PaxSaadat A Khan, MD [facility] Pulmonary Critical Care Medicine

## 2017-07-24 ENCOUNTER — Other Ambulatory Visit (HOSPITAL_COMMUNITY): Payer: Medicare Other | Admitting: Internal Medicine

## 2017-07-24 DIAGNOSIS — J4489 Other specified chronic obstructive pulmonary disease: Secondary | ICD-10-CM

## 2017-07-24 DIAGNOSIS — I82409 Acute embolism and thrombosis of unspecified deep veins of unspecified lower extremity: Secondary | ICD-10-CM

## 2017-07-24 DIAGNOSIS — I482 Chronic atrial fibrillation, unspecified: Secondary | ICD-10-CM

## 2017-07-24 DIAGNOSIS — J9811 Atelectasis: Secondary | ICD-10-CM

## 2017-07-24 DIAGNOSIS — J9621 Acute and chronic respiratory failure with hypoxia: Secondary | ICD-10-CM | POA: Diagnosis not present

## 2017-07-24 DIAGNOSIS — J449 Chronic obstructive pulmonary disease, unspecified: Secondary | ICD-10-CM

## 2017-07-24 DIAGNOSIS — G8220 Paraplegia, unspecified: Secondary | ICD-10-CM

## 2017-07-24 NOTE — Progress Notes (Signed)
[facility]  PROGRESS NOTE  PULMONARY SERVICE ROUNDS  Date of Service: 07/24/2017  Malik Brooks  DOB: 02/13/1955  Referring physician: Larena GlassmanAmir Firozvi, MD  HPI: Malik Brooks is a 62 y.o. male  being seen for Acute on Chronic Respiratory Failure.  Patient right now is on pressure support mode has been able to do T collar for about 15 hours yesterday so we should be able to advance further.  Review of Systems: Unremarkable other than noted in HPI  Allergies:  [facility]ed on the [facility]  Medications: Reviewed  Vitals: Temperature 97.9 pulse 94 respiratory rate 20 blood pressure milligrams 60 saturations 99%  Ventilator Settings: Mode of ventilation pressure support FiO2 50% tidal volume 5 pressure support 12 PEEP 5  Physical Exam: . General:  calm and comfortable NAD . Eyes: normal lids, irises & conjunctiva . ENT: grossly normal tongue not enlarged . Neck: no masses . Cardiovascular: S1 S2 Normal no rubs no gallop . Respiratory: No rhonchi or rales . Abdomen: soft non-distended . Skin: no rash seen on limited exam . Musculoskeletal:  no rigidity . Psychiatric: unable to assess . Neurologic: no involuntary movements          Lab Data and radiological Data:  Reviewed   Assessment/Plan  Patient Active Problem List   Diagnosis Date Noted  . Acute on chronic respiratory failure with hypoxia (HCC) 06/07/2017  . COPD with chronic bronchitis (HCC) 06/07/2017  . Atelectasis, left 06/07/2017  . Chronic atrial fibrillation (HCC)   . DVT (deep venous thrombosis) (HCC)   . Paraplegia (HCC)       1. Acute on chronic respiratory failure with hypoxia we will continue with pulmonary toilet supportive care will remain on T-bar for about 14 hours daily as tolerated.  Rest on the ventilator. 2. COPD chronic bronchitis stable at this time we will continue present management. 3. Atelectasis (continue aggressive pulmonary toilet watch for worsening. 4. Tonic  atrial fibrillation rate is controlled 5. DVT stable 6. Paraplegia at baseline   I have personally evaluated the patient, evaluated the laboratory and imaging results and formulated the assessment and plan and placed orders as needed. The Patient requires high complexity decision making for assessment and support. I have discussed the patient on rounds with the Respiratory Staff   Yevonne PaxSaadat A Margart Zemanek, MD [facility] Pulmonary Critical Care Medicine

## 2017-07-25 ENCOUNTER — Other Ambulatory Visit (HOSPITAL_COMMUNITY): Payer: Medicare Other | Admitting: Internal Medicine

## 2017-07-25 DIAGNOSIS — G8220 Paraplegia, unspecified: Secondary | ICD-10-CM

## 2017-07-25 DIAGNOSIS — I82409 Acute embolism and thrombosis of unspecified deep veins of unspecified lower extremity: Secondary | ICD-10-CM

## 2017-07-25 DIAGNOSIS — I482 Chronic atrial fibrillation, unspecified: Secondary | ICD-10-CM

## 2017-07-25 DIAGNOSIS — J9621 Acute and chronic respiratory failure with hypoxia: Secondary | ICD-10-CM | POA: Diagnosis not present

## 2017-07-25 DIAGNOSIS — J9811 Atelectasis: Secondary | ICD-10-CM

## 2017-07-25 DIAGNOSIS — J449 Chronic obstructive pulmonary disease, unspecified: Secondary | ICD-10-CM

## 2017-07-25 NOTE — Progress Notes (Signed)
[facility]  PROGRESS NOTE  PULMONARY SERVICE ROUNDS  Date of Service: 07/25/2017  Salvatore Decentay Thomas Budden  DOB: 07/31/1955  Referring physician: Larena GlassmanAmir Firozvi, MD  HPI: Salvatore DecentRay Thomas Mani is a 62 y.o. male  being seen for Acute on Chronic Respiratory Failure.  He is weaning on T piece has been on 60% oxygen.  Currently is comfortable without distress  Review of Systems: Unremarkable other than noted in HPI  Allergies:  [facility]  Medications: Reviewed  Vitals: Temperature 98.1 pulse 74 respiratory 20 blood pressure 108/66 saturations 94%  Ventilator Settings: Currently off the ventilator on T piece  Physical Exam: . General:  calm and comfortable NAD . Eyes: normal lids, irises & conjunctiva . ENT: grossly normal tongue not enlarged . Neck: no masses . Cardiovascular: S1 S2 Normal no rubs no gallop . Respiratory: No rhonchi rales are noted . Abdomen: soft non-distended . Skin: no rash seen on limited exam . Musculoskeletal:  no rigidity . Psychiatric: unable to assess . Neurologic: no involuntary movements          Lab Data and radiological Data:  Labs have been reviewed   Assessment/Plan  Patient Active Problem List   Diagnosis Date Noted  . Acute on chronic respiratory failure with hypoxia (HCC) 06/07/2017  . COPD with chronic bronchitis (HCC) 06/07/2017  . Atelectasis, left 06/07/2017  . Chronic atrial fibrillation (HCC)   . DVT (deep venous thrombosis) (HCC)   . Paraplegia (HCC)       1. Acute on chronic respiratory failure with hypoxia we will continue with T bar weans during the daytime.  Awaiting word as far as his discharge plan is concerned 2. COPD severe disease we will continue present management. 3. Chronic atelectasis continue aggressive pulmonary toilet. 4. Chronic atrial fibrillation rate is controlled 5. DVT at baseline 6. Paraplegia we will continue with supportive care prognosis guarded   I have personally  evaluated the patient, evaluated the laboratory and imaging results and formulated the assessment and plan and placed orders as needed. The Patient requires high complexity decision making for assessment and support. I have discussed the patient on rounds with the Respiratory Staff   Yevonne PaxSaadat A Khan, MD [facility] Pulmonary Critical Care Medicine

## 2017-07-26 ENCOUNTER — Other Ambulatory Visit (HOSPITAL_COMMUNITY): Payer: Medicare Other | Admitting: Internal Medicine

## 2017-07-26 DIAGNOSIS — I482 Chronic atrial fibrillation, unspecified: Secondary | ICD-10-CM

## 2017-07-26 DIAGNOSIS — J9621 Acute and chronic respiratory failure with hypoxia: Secondary | ICD-10-CM | POA: Diagnosis not present

## 2017-07-26 DIAGNOSIS — I82409 Acute embolism and thrombosis of unspecified deep veins of unspecified lower extremity: Secondary | ICD-10-CM

## 2017-07-26 DIAGNOSIS — J9811 Atelectasis: Secondary | ICD-10-CM | POA: Diagnosis not present

## 2017-07-26 DIAGNOSIS — G8220 Paraplegia, unspecified: Secondary | ICD-10-CM

## 2017-07-26 DIAGNOSIS — J449 Chronic obstructive pulmonary disease, unspecified: Secondary | ICD-10-CM | POA: Diagnosis not present

## 2017-07-26 NOTE — Progress Notes (Signed)
[facility]  PROGRESS NOTE  PULMONARY SERVICE ROUNDS  Date of Service: 07/26/2017  Malik Brooks  DOB: 06/22/1955  Referring physician: Larena GlassmanAmir Firozvi, MD  HPI: Malik Brooks is a 62 y.o. male  being seen for Acute on Chronic Respiratory Failure.  Remains on the ventilator was on pressure support check the spontaneous breathing index  Review of Systems: Unremarkable other than noted in HPI  Allergies:  [facility] the [facility]  Medications: Reviewed  Vitals: Temperature 98.0 pulse 74 respiratory rate 20 blood pressure 120/70 saturations 98%  Ventilator Settings: Mode of ventilation pressure support FiO2 50% tidal volume 456.5  Physical Exam: . General:  calm and comfortable NAD . Eyes: normal lids, irises & conjunctiva . ENT: grossly normal tongue not enlarged . Neck: no masses . Cardiovascular: S1 S2 Normal no rubs no gallop . Respiratory: No rhonchi or rales are noted . Abdomen: soft non-distended . Skin: no rash seen on limited exam . Musculoskeletal:  no rigidity . Psychiatric: unable to assess . Neurologic: no involuntary movements          Lab Data and radiological Data:  No labs to review   Assessment/Plan  Patient Active Problem List   Diagnosis Date Noted  . Acute on chronic respiratory failure with hypoxia (HCC) 06/07/2017  . COPD with chronic bronchitis (HCC) 06/07/2017  . Atelectasis, left 06/07/2017  . Chronic atrial fibrillation (HCC)   . DVT (deep venous thrombosis) (HCC)   . Paraplegia (HCC)       1. Acute on chronic respiratory failure with hypoxia we will advance weaning hopefully to T collar.  Continue with supportive care. 2. COPD severe disease we will continue present management. 3. Left-sided atelectasis continue present supportive care pulmonary toilet 4. Chronic atrial fibrillation rate is controlled 5. Paraplegia stable at baseline 6. DVT treated   I have personally evaluated the patient, evaluated the  laboratory and imaging results and formulated the assessment and plan and placed orders as needed. The Patient requires high complexity decision making for assessment and support. I have discussed the patient on rounds with the Respiratory Staff   Yevonne PaxSaadat A Kayani Rapaport, MD [facility] Pulmonary Critical Care Medicine

## 2017-07-27 ENCOUNTER — Other Ambulatory Visit (HOSPITAL_COMMUNITY): Payer: Medicare Other | Admitting: Internal Medicine

## 2017-07-27 DIAGNOSIS — G8220 Paraplegia, unspecified: Secondary | ICD-10-CM

## 2017-07-27 DIAGNOSIS — J9811 Atelectasis: Secondary | ICD-10-CM | POA: Diagnosis not present

## 2017-07-27 DIAGNOSIS — I482 Chronic atrial fibrillation, unspecified: Secondary | ICD-10-CM

## 2017-07-27 DIAGNOSIS — J9621 Acute and chronic respiratory failure with hypoxia: Secondary | ICD-10-CM | POA: Diagnosis not present

## 2017-07-27 DIAGNOSIS — I82409 Acute embolism and thrombosis of unspecified deep veins of unspecified lower extremity: Secondary | ICD-10-CM

## 2017-07-27 DIAGNOSIS — J449 Chronic obstructive pulmonary disease, unspecified: Secondary | ICD-10-CM | POA: Diagnosis not present

## 2017-07-27 DIAGNOSIS — J4489 Other specified chronic obstructive pulmonary disease: Secondary | ICD-10-CM

## 2017-07-27 NOTE — Progress Notes (Signed)
[facility]  PROGRESS NOTE  PULMONARY SERVICE ROUNDS  Date of Service: 07/27/2017  Salvatore Decentay Thomas Cavallaro  DOB: 02/19/1955  Referring physician: Larena GlassmanAmir Firozvi, MD  HPI: Salvatore DecentRay Thomas Spenser is a [age] y.o. male  being seen for Acute on Chronic Respiratory Failure.  He is weaning this morning was on T collar about 50% FiO2.  Patient was also tolerating his Passy-Muir valve without difficulty.  Vocalizes no significant complaints  Review of Systems: Unremarkable other than noted in HPI  Allergies:  [facility] the [facility]  Medications: Reviewed  Vitals: Temperature 98.0 pulse 76 respiratory rate 18 blood pressure 135 and 72 saturations 96%  Ventilator Settings: Currently is off the ventilator weaning on T collar  Physical Exam: . General:  calm and comfortable NAD . Eyes: normal lids, irises & conjunctiva . ENT: grossly normal tongue not enlarged . Neck: no masses . Cardiovascular: S1 S2 Normal no rubs no gallop . Respiratory: Coarse breath sounds no rhonchi . Abdomen: soft non-distended . Skin: no rash seen on limited exam . Musculoskeletal:  no rigidity . Psychiatric: unable to assess . Neurologic: no involuntary movements          Lab Data and radiological Data:  Labs were reviewed   Assessment/Plan  Patient Active Problem List   Diagnosis Date Noted  . Acute on chronic respiratory failure with hypoxia (HCC) 06/07/2017  . COPD with chronic bronchitis (HCC) 06/07/2017  . Atelectasis, left 06/07/2017  . Chronic atrial fibrillation (HCC)   . DVT (deep venous thrombosis) (HCC)   . Paraplegia (HCC)       1. Acute on chronic respiratory failure with hypoxia we will continue with weaning on T collar.  Patient is also tolerating the PMV.  Maximum goal is about 14 hours.  Patient might be able to advance to 18 hours as he continues to do well. 2. Chronic atrial fibrillation rate is controlled we will continue pulmonary toilet supportive care. 3. COPD severe  disease we will continue to monitor 4. Chronic atelectasis continue pulmonary toilet follow x-rays. 5. DVT treated 6. Paraplegia at baseline   I have personally evaluated the patient, evaluated the laboratory and imaging results and formulated the assessment and plan and placed orders as needed. The Patient requires high complexity decision making for assessment and support. I have discussed the patient on rounds with the Respiratory Staff   Yevonne PaxSaadat A Eufelia Veno, MD [facility] Pulmonary Critical Care Medicine

## 2017-07-29 ENCOUNTER — Other Ambulatory Visit (HOSPITAL_COMMUNITY): Payer: Medicare Other | Admitting: Internal Medicine

## 2017-07-29 DIAGNOSIS — J9621 Acute and chronic respiratory failure with hypoxia: Secondary | ICD-10-CM | POA: Diagnosis not present

## 2017-07-29 DIAGNOSIS — J9811 Atelectasis: Secondary | ICD-10-CM

## 2017-07-29 DIAGNOSIS — G8220 Paraplegia, unspecified: Secondary | ICD-10-CM

## 2017-07-29 DIAGNOSIS — I482 Chronic atrial fibrillation, unspecified: Secondary | ICD-10-CM

## 2017-07-29 DIAGNOSIS — J449 Chronic obstructive pulmonary disease, unspecified: Secondary | ICD-10-CM | POA: Diagnosis not present

## 2017-07-29 DIAGNOSIS — I82409 Acute embolism and thrombosis of unspecified deep veins of unspecified lower extremity: Secondary | ICD-10-CM

## 2017-07-29 NOTE — Progress Notes (Signed)
[facility]  PROGRESS NOTE  PULMONARY SERVICE ROUNDS  Date of Service: 07/29/2017  Malik Brooks  DOB: 07/22/1955  Referring physician: Larena GlassmanAmir Firozvi, MD  HPI: Malik Brooks is a 62 y.o. male  being seen for Acute on Chronic Respiratory Failure.  Patient is scheduled for discharge in the morning.  He had some questions regarding possibility of home ventilation.  Because of his current pulmonary status with excessive secretions he is not a safe candidate to be discharged home with a trilogy ventilator.  I think at some point when he improves clinically it may be possible but at this stage she needs to continue with the plan to discharge to a skilled nursing facility.  His family was present at the bedside and they were also updated.  Review of Systems: Unremarkable other than noted in HPI  Allergies:  [facility] the [facility]  Medications: Reviewed  Vitals: Temperature 97.1 pulse 83 respiratory rate 20 blood pressure 104 60 saturations 94%  Ventilator Settings: Off the ventilator on T collar currently is on 28% FiO2 has the PMV in place and is tolerating  Physical Exam: . General:  calm and comfortable NAD . Eyes: normal lids, irises & conjunctiva . ENT: grossly normal tongue not enlarged . Neck: no masses . Cardiovascular: S1 S2 Normal no rubs no gallop . Respiratory: No rhonchi or rales are noted at this time. . Abdomen: soft non-distended . Skin: no rash seen on limited exam . Musculoskeletal:  no rigidity . Psychiatric: unable to assess . Neurologic: no involuntary movements          Lab Data and radiological Data:  No labs to report   Assessment/Plan  Patient Active Problem List   Diagnosis Date Noted  . Acute on chronic respiratory failure with hypoxia (HCC) 06/07/2017  . COPD with chronic bronchitis (HCC) 06/07/2017  . Atelectasis, left 06/07/2017  . Chronic atrial fibrillation (HCC)   . DVT (deep venous thrombosis) (HCC)   .  Paraplegia (HCC)       1. Acute on chronic respiratory failure with hypoxia we will continue with T collar weans as tolerated.  Plan on discharge in the morning to skilled nursing facility. 2. Chronic obstructive pulmonary disease with bronchitis clinically improved we will continue present management. 3. Left recurring atelectasis continue chest PT secretion management 4. Chronic atrial fibrillation rate is controlled we will follow 5. DVT stable 6. Paraplegia at baseline   I have personally evaluated the patient, evaluated the laboratory and imaging results and formulated the assessment and plan and placed orders as needed. The Patient requires high complexity decision making for assessment and support. I have discussed the patient on rounds with the Respiratory Staff   Yevonne PaxSaadat A Ketra Duchesne, MD [facility] Pulmonary Critical Care Medicine

## 2017-11-04 ENCOUNTER — Encounter (HOSPITAL_COMMUNITY): Payer: Self-pay

## 2017-11-04 ENCOUNTER — Other Ambulatory Visit: Payer: Self-pay

## 2017-11-04 ENCOUNTER — Inpatient Hospital Stay (HOSPITAL_COMMUNITY)
Admission: EM | Admit: 2017-11-04 | Discharge: 2017-11-12 | DRG: 207 | Disposition: A | Discharge status: Other Acute Inpatient Hospital | Payer: Medicare Other | Attending: Internal Medicine | Admitting: Internal Medicine

## 2017-11-04 ENCOUNTER — Emergency Department (HOSPITAL_COMMUNITY): Payer: Medicare Other

## 2017-11-04 ENCOUNTER — Inpatient Hospital Stay (HOSPITAL_COMMUNITY): Payer: Medicare Other

## 2017-11-04 DIAGNOSIS — E119 Type 2 diabetes mellitus without complications: Secondary | ICD-10-CM | POA: Diagnosis present

## 2017-11-04 DIAGNOSIS — I2510 Atherosclerotic heart disease of native coronary artery without angina pectoris: Secondary | ICD-10-CM | POA: Diagnosis not present

## 2017-11-04 DIAGNOSIS — I619 Nontraumatic intracerebral hemorrhage, unspecified: Secondary | ICD-10-CM | POA: Diagnosis present

## 2017-11-04 DIAGNOSIS — R7881 Bacteremia: Secondary | ICD-10-CM | POA: Diagnosis present

## 2017-11-04 DIAGNOSIS — T148XXA Other injury of unspecified body region, initial encounter: Secondary | ICD-10-CM

## 2017-11-04 DIAGNOSIS — J9811 Atelectasis: Secondary | ICD-10-CM | POA: Diagnosis not present

## 2017-11-04 DIAGNOSIS — J440 Chronic obstructive pulmonary disease with acute lower respiratory infection: Secondary | ICD-10-CM | POA: Diagnosis present

## 2017-11-04 DIAGNOSIS — I611 Nontraumatic intracerebral hemorrhage in hemisphere, cortical: Secondary | ICD-10-CM | POA: Diagnosis not present

## 2017-11-04 DIAGNOSIS — Y95 Nosocomial condition: Secondary | ICD-10-CM | POA: Diagnosis present

## 2017-11-04 DIAGNOSIS — E871 Hypo-osmolality and hyponatremia: Secondary | ICD-10-CM | POA: Diagnosis present

## 2017-11-04 DIAGNOSIS — R52 Pain, unspecified: Secondary | ICD-10-CM

## 2017-11-04 DIAGNOSIS — Z930 Tracheostomy status: Secondary | ICD-10-CM

## 2017-11-04 DIAGNOSIS — L8990 Pressure ulcer of unspecified site, unspecified stage: Secondary | ICD-10-CM | POA: Diagnosis present

## 2017-11-04 DIAGNOSIS — I482 Chronic atrial fibrillation, unspecified: Secondary | ICD-10-CM | POA: Diagnosis not present

## 2017-11-04 DIAGNOSIS — Z7901 Long term (current) use of anticoagulants: Secondary | ICD-10-CM

## 2017-11-04 DIAGNOSIS — D500 Iron deficiency anemia secondary to blood loss (chronic): Secondary | ICD-10-CM | POA: Diagnosis present

## 2017-11-04 DIAGNOSIS — B9562 Methicillin resistant Staphylococcus aureus infection as the cause of diseases classified elsewhere: Secondary | ICD-10-CM | POA: Diagnosis not present

## 2017-11-04 DIAGNOSIS — J9601 Acute respiratory failure with hypoxia: Secondary | ICD-10-CM

## 2017-11-04 DIAGNOSIS — T17890A Other foreign object in other parts of respiratory tract causing asphyxiation, initial encounter: Secondary | ICD-10-CM | POA: Diagnosis present

## 2017-11-04 DIAGNOSIS — I9589 Other hypotension: Secondary | ICD-10-CM | POA: Diagnosis not present

## 2017-11-04 DIAGNOSIS — F419 Anxiety disorder, unspecified: Secondary | ICD-10-CM | POA: Diagnosis present

## 2017-11-04 DIAGNOSIS — B3749 Other urogenital candidiasis: Secondary | ICD-10-CM | POA: Diagnosis present

## 2017-11-04 DIAGNOSIS — Z431 Encounter for attention to gastrostomy: Secondary | ICD-10-CM

## 2017-11-04 DIAGNOSIS — G8220 Paraplegia, unspecified: Secondary | ICD-10-CM | POA: Diagnosis present

## 2017-11-04 DIAGNOSIS — R9431 Abnormal electrocardiogram [ECG] [EKG]: Secondary | ICD-10-CM | POA: Diagnosis not present

## 2017-11-04 DIAGNOSIS — E873 Alkalosis: Secondary | ICD-10-CM | POA: Diagnosis present

## 2017-11-04 DIAGNOSIS — Z515 Encounter for palliative care: Secondary | ICD-10-CM

## 2017-11-04 DIAGNOSIS — J189 Pneumonia, unspecified organism: Secondary | ICD-10-CM | POA: Diagnosis present

## 2017-11-04 DIAGNOSIS — Z931 Gastrostomy status: Secondary | ICD-10-CM

## 2017-11-04 DIAGNOSIS — I10 Essential (primary) hypertension: Secondary | ICD-10-CM | POA: Diagnosis present

## 2017-11-04 DIAGNOSIS — B952 Enterococcus as the cause of diseases classified elsewhere: Secondary | ICD-10-CM | POA: Diagnosis present

## 2017-11-04 DIAGNOSIS — T80219S Unspecified infection due to central venous catheter, sequela: Secondary | ICD-10-CM | POA: Diagnosis not present

## 2017-11-04 DIAGNOSIS — B957 Other staphylococcus as the cause of diseases classified elsewhere: Secondary | ICD-10-CM | POA: Diagnosis present

## 2017-11-04 DIAGNOSIS — L89154 Pressure ulcer of sacral region, stage 4: Secondary | ICD-10-CM | POA: Diagnosis not present

## 2017-11-04 DIAGNOSIS — Z1621 Resistance to vancomycin: Secondary | ICD-10-CM | POA: Diagnosis present

## 2017-11-04 DIAGNOSIS — T80219A Unspecified infection due to central venous catheter, initial encounter: Secondary | ICD-10-CM | POA: Diagnosis not present

## 2017-11-04 DIAGNOSIS — J449 Chronic obstructive pulmonary disease, unspecified: Secondary | ICD-10-CM | POA: Diagnosis not present

## 2017-11-04 DIAGNOSIS — Z79899 Other long term (current) drug therapy: Secondary | ICD-10-CM

## 2017-11-04 DIAGNOSIS — J9621 Acute and chronic respiratory failure with hypoxia: Principal | ICD-10-CM | POA: Diagnosis present

## 2017-11-04 DIAGNOSIS — E785 Hyperlipidemia, unspecified: Secondary | ICD-10-CM | POA: Diagnosis present

## 2017-11-04 DIAGNOSIS — Z66 Do not resuscitate: Secondary | ICD-10-CM | POA: Diagnosis present

## 2017-11-04 DIAGNOSIS — Z6841 Body Mass Index (BMI) 40.0 and over, adult: Secondary | ICD-10-CM

## 2017-11-04 DIAGNOSIS — I469 Cardiac arrest, cause unspecified: Secondary | ICD-10-CM | POA: Diagnosis not present

## 2017-11-04 DIAGNOSIS — R001 Bradycardia, unspecified: Secondary | ICD-10-CM | POA: Diagnosis present

## 2017-11-04 DIAGNOSIS — J9690 Respiratory failure, unspecified, unspecified whether with hypoxia or hypercapnia: Secondary | ICD-10-CM

## 2017-11-04 DIAGNOSIS — S24102S Unspecified injury at T2-T6 level of thoracic spinal cord, sequela: Secondary | ICD-10-CM | POA: Diagnosis not present

## 2017-11-04 DIAGNOSIS — J9611 Chronic respiratory failure with hypoxia: Secondary | ICD-10-CM | POA: Diagnosis not present

## 2017-11-04 DIAGNOSIS — N179 Acute kidney failure, unspecified: Secondary | ICD-10-CM | POA: Diagnosis not present

## 2017-11-04 DIAGNOSIS — I82409 Acute embolism and thrombosis of unspecified deep veins of unspecified lower extremity: Secondary | ICD-10-CM | POA: Diagnosis not present

## 2017-11-04 DIAGNOSIS — I468 Cardiac arrest due to other underlying condition: Secondary | ICD-10-CM | POA: Diagnosis present

## 2017-11-04 DIAGNOSIS — N390 Urinary tract infection, site not specified: Secondary | ICD-10-CM | POA: Diagnosis not present

## 2017-11-04 DIAGNOSIS — F17200 Nicotine dependence, unspecified, uncomplicated: Secondary | ICD-10-CM | POA: Diagnosis present

## 2017-11-04 DIAGNOSIS — Z9911 Dependence on respirator [ventilator] status: Secondary | ICD-10-CM | POA: Diagnosis not present

## 2017-11-04 DIAGNOSIS — Z86718 Personal history of other venous thrombosis and embolism: Secondary | ICD-10-CM

## 2017-11-04 DIAGNOSIS — T83511A Infection and inflammatory reaction due to indwelling urethral catheter, initial encounter: Secondary | ICD-10-CM | POA: Diagnosis not present

## 2017-11-04 LAB — CBC WITH DIFFERENTIAL/PLATELET
ABS IMMATURE GRANULOCYTES: 0.5 10*3/uL — AB (ref 0.00–0.07)
BASOS PCT: 0 %
Basophils Absolute: 0 10*3/uL (ref 0.0–0.1)
EOS PCT: 0 %
Eosinophils Absolute: 0.1 10*3/uL (ref 0.0–0.5)
HCT: 20.5 % — ABNORMAL LOW (ref 39.0–52.0)
HEMOGLOBIN: 6.2 g/dL — AB (ref 13.0–17.0)
Immature Granulocytes: 2 %
Lymphocytes Relative: 4 %
Lymphs Abs: 0.7 10*3/uL (ref 0.7–4.0)
MCH: 23.7 pg — AB (ref 26.0–34.0)
MCHC: 30.2 g/dL (ref 30.0–36.0)
MCV: 78.2 fL — AB (ref 80.0–100.0)
MONO ABS: 0.4 10*3/uL (ref 0.1–1.0)
Monocytes Relative: 2 %
Neutro Abs: 19 10*3/uL — ABNORMAL HIGH (ref 1.7–7.7)
Neutrophils Relative %: 92 %
PLATELETS: 288 10*3/uL (ref 150–400)
RBC: 2.62 MIL/uL — AB (ref 4.22–5.81)
RDW: 19.8 % — ABNORMAL HIGH (ref 11.5–15.5)
WBC: 20.8 10*3/uL — AB (ref 4.0–10.5)
nRBC: 0 % (ref 0.0–0.2)

## 2017-11-04 LAB — I-STAT CHEM 8, ED
BUN: 112 mg/dL — ABNORMAL HIGH (ref 8–23)
CHLORIDE: 95 mmol/L — AB (ref 98–111)
CREATININE: 5.2 mg/dL — AB (ref 0.61–1.24)
Calcium, Ion: 0.79 mmol/L — CL (ref 1.15–1.40)
GLUCOSE: 203 mg/dL — AB (ref 70–99)
HCT: 17 % — ABNORMAL LOW (ref 39.0–52.0)
Hemoglobin: 5.8 g/dL — CL (ref 13.0–17.0)
POTASSIUM: 4.9 mmol/L (ref 3.5–5.1)
SODIUM: 126 mmol/L — AB (ref 135–145)
TCO2: 17 mmol/L — ABNORMAL LOW (ref 22–32)

## 2017-11-04 LAB — COMPREHENSIVE METABOLIC PANEL
ALT: 5 U/L (ref 0–44)
AST: 11 U/L — AB (ref 15–41)
Albumin: 1.3 g/dL — ABNORMAL LOW (ref 3.5–5.0)
Alkaline Phosphatase: 70 U/L (ref 38–126)
Anion gap: 12 (ref 5–15)
BILIRUBIN TOTAL: 0.6 mg/dL (ref 0.3–1.2)
BUN: 113 mg/dL — AB (ref 8–23)
CALCIUM: 6.1 mg/dL — AB (ref 8.9–10.3)
CO2: 18 mmol/L — ABNORMAL LOW (ref 22–32)
CREATININE: 4.62 mg/dL — AB (ref 0.61–1.24)
Chloride: 96 mmol/L — ABNORMAL LOW (ref 98–111)
GFR calc Af Amer: 14 mL/min — ABNORMAL LOW (ref >60)
GFR, EST NON AFRICAN AMERICAN: 12 mL/min — AB (ref >60)
Glucose, Bld: 206 mg/dL — ABNORMAL HIGH (ref 70–99)
Potassium: 4.9 mmol/L (ref 3.5–5.1)
Sodium: 126 mmol/L — ABNORMAL LOW (ref 135–145)
TOTAL PROTEIN: 4.6 g/dL — AB (ref 6.5–8.1)

## 2017-11-04 LAB — I-STAT TROPONIN, ED: TROPONIN I, POC: 0.05 ng/mL (ref 0.00–0.08)

## 2017-11-04 LAB — PROTIME-INR
INR: 1.96
PROTHROMBIN TIME: 22 s — AB (ref 11.4–15.2)

## 2017-11-04 LAB — I-STAT ARTERIAL BLOOD GAS, ED
ACID-BASE DEFICIT: 4 mmol/L — AB (ref 0.0–2.0)
BICARBONATE: 21 mmol/L (ref 20.0–28.0)
O2 SAT: 100 %
PCO2 ART: 37.1 mmHg (ref 32.0–48.0)
PO2 ART: 238 mmHg — AB (ref 83.0–108.0)
Patient temperature: 98.7
TCO2: 22 mmol/L (ref 22–32)
pH, Arterial: 7.361 (ref 7.350–7.450)

## 2017-11-04 LAB — PHOSPHORUS: Phosphorus: 6.5 mg/dL — ABNORMAL HIGH (ref 2.5–4.6)

## 2017-11-04 LAB — GLUCOSE, CAPILLARY: Glucose-Capillary: 129 mg/dL — ABNORMAL HIGH (ref 70–99)

## 2017-11-04 LAB — PROCALCITONIN: PROCALCITONIN: 1.41 ng/mL

## 2017-11-04 LAB — ABO/RH: ABO/RH(D): O POS

## 2017-11-04 LAB — HEMOGLOBIN AND HEMATOCRIT, BLOOD
HEMATOCRIT: 26.6 % — AB (ref 39.0–52.0)
Hemoglobin: 8.8 g/dL — ABNORMAL LOW (ref 13.0–17.0)

## 2017-11-04 LAB — I-STAT CG4 LACTIC ACID, ED: LACTIC ACID, VENOUS: 1.78 mmol/L (ref 0.5–1.9)

## 2017-11-04 LAB — MAGNESIUM: MAGNESIUM: 2.2 mg/dL (ref 1.7–2.4)

## 2017-11-04 MED ORDER — IPRATROPIUM-ALBUTEROL 0.5-2.5 (3) MG/3ML IN SOLN
3.0000 mL | Freq: Four times a day (QID) | RESPIRATORY_TRACT | Status: DC
  Administered 2017-11-04 – 2017-11-12 (×33): 3 mL via RESPIRATORY_TRACT
  Filled 2017-11-04 (×34): qty 3

## 2017-11-04 MED ORDER — SODIUM CHLORIDE 0.9 % IV SOLN
2.0000 g | Freq: Once | INTRAVENOUS | Status: AC
  Administered 2017-11-04: 2 g via INTRAVENOUS
  Filled 2017-11-04: qty 2

## 2017-11-04 MED ORDER — VANCOMYCIN HCL 10 G IV SOLR
2500.0000 mg | Freq: Once | INTRAVENOUS | Status: AC
  Administered 2017-11-04: 2500 mg via INTRAVENOUS
  Filled 2017-11-04: qty 2500

## 2017-11-04 MED ORDER — IPRATROPIUM-ALBUTEROL 0.5-2.5 (3) MG/3ML IN SOLN
RESPIRATORY_TRACT | Status: AC
  Administered 2017-11-04: 3 mL via RESPIRATORY_TRACT
  Filled 2017-11-04: qty 3

## 2017-11-04 MED ORDER — FENTANYL CITRATE (PF) 100 MCG/2ML IJ SOLN
100.0000 ug | INTRAMUSCULAR | Status: DC | PRN
  Administered 2017-11-05 – 2017-11-08 (×9): 100 ug via INTRAVENOUS
  Filled 2017-11-04 (×9): qty 2

## 2017-11-04 MED ORDER — SODIUM CHLORIDE 0.9 % IV SOLN
250.0000 mL | INTRAVENOUS | Status: DC
  Administered 2017-11-04: 250 mL via INTRAVENOUS

## 2017-11-04 MED ORDER — CHLORHEXIDINE GLUCONATE 0.12% ORAL RINSE (MEDLINE KIT)
15.0000 mL | Freq: Two times a day (BID) | OROMUCOSAL | Status: DC
  Administered 2017-11-04 – 2017-11-12 (×14): 15 mL via OROMUCOSAL

## 2017-11-04 MED ORDER — FAMOTIDINE IN NACL 20-0.9 MG/50ML-% IV SOLN
20.0000 mg | Freq: Two times a day (BID) | INTRAVENOUS | Status: DC
  Filled 2017-11-04: qty 50

## 2017-11-04 MED ORDER — FAMOTIDINE IN NACL 20-0.9 MG/50ML-% IV SOLN
20.0000 mg | INTRAVENOUS | Status: DC
  Administered 2017-11-04 – 2017-11-06 (×3): 20 mg via INTRAVENOUS
  Filled 2017-11-04 (×4): qty 50

## 2017-11-04 MED ORDER — VANCOMYCIN VARIABLE DOSE PER UNSTABLE RENAL FUNCTION (PHARMACIST DOSING): Status: DC

## 2017-11-04 MED ORDER — NOREPINEPHRINE 4 MG/250ML-% IV SOLN
0.0000 ug/min | Freq: Once | INTRAVENOUS | Status: AC
  Administered 2017-11-04: 10 ug/min via INTRAVENOUS

## 2017-11-04 MED ORDER — INSULIN ASPART 100 UNIT/ML ~~LOC~~ SOLN
0.0000 [IU] | SUBCUTANEOUS | Status: DC
  Administered 2017-11-04 – 2017-11-11 (×14): 2 [IU] via SUBCUTANEOUS
  Administered 2017-11-12: 3 [IU] via SUBCUTANEOUS
  Administered 2017-11-12 (×3): 2 [IU] via SUBCUTANEOUS

## 2017-11-04 MED ORDER — SODIUM CHLORIDE 0.9 % IV SOLN
1.0000 g | INTRAVENOUS | Status: DC
  Administered 2017-11-05 – 2017-11-08 (×4): 1 g via INTRAVENOUS
  Filled 2017-11-04 (×5): qty 1

## 2017-11-04 MED ORDER — NOREPINEPHRINE 4 MG/250ML-% IV SOLN
5.0000 ug/min | INTRAVENOUS | Status: DC
  Administered 2017-11-04: 8 ug/min via INTRAVENOUS
  Administered 2017-11-04: 10 ug/min via INTRAVENOUS
  Administered 2017-11-05: 4 ug/min via INTRAVENOUS
  Administered 2017-11-06: 6 ug/min via INTRAVENOUS
  Administered 2017-11-06: 8 ug/min via INTRAVENOUS
  Administered 2017-11-07: 7 ug/min via INTRAVENOUS
  Filled 2017-11-04 (×6): qty 250

## 2017-11-04 MED ORDER — VANCOMYCIN HCL 10 G IV SOLR: 1750.0000 mg | INTRAVENOUS | Status: DC

## 2017-11-04 MED ORDER — SODIUM CHLORIDE 0.9% IV SOLUTION: Freq: Once | INTRAVENOUS | Status: DC

## 2017-11-04 MED ORDER — ORAL CARE MOUTH RINSE
15.0000 mL | OROMUCOSAL | Status: DC
  Administered 2017-11-04 – 2017-11-12 (×54): 15 mL via OROMUCOSAL

## 2017-11-04 MED ORDER — FENTANYL CITRATE (PF) 100 MCG/2ML IJ SOLN
100.0000 ug | INTRAMUSCULAR | Status: DC | PRN
  Administered 2017-11-06: 100 ug via INTRAVENOUS
  Filled 2017-11-04 (×2): qty 2

## 2017-11-04 MED ORDER — SODIUM CHLORIDE 0.9 % IV SOLN
INTRAVENOUS | Status: DC
  Administered 2017-11-04 – 2017-11-05 (×3): via INTRAVENOUS
  Administered 2017-11-05: 100 mL/h via INTRAVENOUS
  Administered 2017-11-06 – 2017-11-12 (×5): via INTRAVENOUS

## 2017-11-04 NOTE — H&P (Signed)
NAME:  Malik Brooks, MRN:  [id number], DOB:  Jan 06, 1956, LOS: 0 ADMISSION DATE:  11/04/2017, CONSULTATION DATE:  11/04/2017 REFERRING MD:  Francene Castle Madilyn Hook, CHIEF COMPLAINT:  Cardiac arrest   Brief History   Patient is a 62 year old male [facility] resident with a chronic trach that had some respiratory difficulties in [facility] and suffered a cardiac arrest.  Patient was DNR but received CPR, amiodarone, 2 rounds of epi and a shock with total downtime of 15 minutes. Evidently patient has been going to the urologist for chronic hematuria and requiring frequent transfusion.  In [facility], it was felt that the arrest was a respiratory code and patient was resuscitated despite of being DNR.  Patient evidently is vent dependent in [facility] after a prolonged illness.  Also has history of COPD on inhaled steroids and bronchodilators and has had significant mucous plugging and has required frequent bronchoscopies in the past.  Past Medical History   Significant Hospital Events   10/21 cardiac arrest presumed from a cardiac source.  Consults: date of consult/date signed off & final recs:    Procedures (surgical and bedside):  CPR 10/21  Significant Diagnostic Tests:  Head CT 10/21>>>  Micro Data:  Blood 10/21>>> Urine 10/21>>> Sputum 10/21>>>  Antimicrobials:  Cefepime 10/21>>> Vancoycin 10/21>>>   Subjective:  N/A  Objective   Blood pressure (!) 104/51, pulse (!) 47, temperature (!) 96.1 F (35.6 C), resp. rate 20, height 6' (1.829 m), SpO2 100 %.    Vent Mode: PCV FiO2 (%):  [100 %] 100 % Set Rate:  [20 bmp] 20 bmp PEEP:  [5 cmH20] 5 cmH20 Plateau Pressure:  [30 cmH20] 30 cmH20  No intake or output data in the 24 hours ending 11/04/17 1459 There were no vitals filed for this visit.  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  Patient was DNR and suffered a cardiac arrest and was resuscitated and sent to [facility].  Discussed with PCCM-NP and EDP..  Cardiac arrest: presumed from a respiratory source  - Tele monitoring  - No anti-coagulation given hematuria  - No need for cardiology consult for now  - Given bleeding and level severe debility and DNR status will not offer TTM protocol  - EKG  Chronic respiratory failure:   - Maintain on full vent support  - ABG and adjust vent to ABG  - Will not change trach for now  - Titrate O2 for sat of 88-92%  - Will defer bronch for now  Acute renal failure:  - Hydrate  - BMET in AM  - Replace electrolytes as indicated  Anoxic injury:  - EEG  - CT of the head  - If CT is negative then will consider MRI  - Neurology not called yet  GOC: DNR status, no further escalation of care to include increasing pressors or any other invasive procedure unless  significant neurologic recovery, will re-evaluate in 24 hours, if no improvement then proceed with comfort care.  This was discussed with the niece.  Disposition / Summary of Today's Plan 11/04/17   As above   Labs   CBC: Recent Labs  Lab 11/04/17 1330 11/04/17 1332  WBC 20.8*  --   NEUTROABS 19.0*  --   HGB 6.2* 5.8*  HCT 20.5* 17.0*  MCV 78.2*  --   PLT 288  --     Basic Metabolic Panel: Recent Labs  Lab 11/04/17 1330 11/04/17 1332  NA 126* 126*  K 4.9 4.9  CL 96* 95*  CO2 18*  --   GLUCOSE 206* 203*    BUN 113* 112*  CREATININE 4.62* 5.20*  CALCIUM 6.1*  --    GFR: CrCl cannot be calculated (Unknown ideal weight.). Recent Labs  Lab 11/04/17 1330 11/04/17 1349  WBC 20.8*  --   LATICACIDVEN  --  1.78    Liver Function Tests: Recent Labs  Lab 11/04/17 1330  AST 11*  ALT 5  ALKPHOS 70  BILITOT 0.6  PROT 4.6*  ALBUMIN 1.3*   No results for input(s): LIPASE, AMYLASE in the last 168 hours. No results for input(s): AMMONIA in the last 168 hours.  ABG    Component Value Date/Time   PHART 7.361 11/04/2017 1415   PCO2ART 37.1 11/04/2017 1415   PO2ART 238.0 (H) 11/04/2017 1415   HCO3 21.0 11/04/2017 1415   TCO2 22 11/04/2017 1415   ACIDBASEDEF 4.0 (H) 11/04/2017 1415   O2SAT 100.0 11/04/2017 1415     Coagulation Profile: Recent Labs  Lab 11/04/17 1330  INR 1.96    Cardiac Enzymes: No results for input(s): CKTOTAL, CKMB, CKMBINDEX, TROPONINI in the last 168 hours.  HbA1C: No results found for: HGBA1C  CBG: No results for input(s): GLUCAP in the last 168 hours.  Admitting History of Present Illness.     Review of Systems:   Unattainable  Past Medical History  He,  has a past medical history of Acute on chronic respiratory failure with hypoxia (HCC) (06/07/2017), Atelectasis, left (06/07/2017), Chronic atrial fibrillation, COPD with chronic bronchitis (HCC) (06/07/2017), Coronary artery disease, Diabetes mellitus (HCC), DVT (deep venous thrombosis) (HCC), Hypertension, and Paraplegia (HCC).   Surgical History    Past Surgical History:  Procedure Laterality Date  . PEG PLACEMENT    . TRACHEOSTOMY       Social History   Social History   Socioeconomic History  . Marital status: Married    Spouse name: Not on file  . Number of children: Not on file  . Years of education: Not on file  . Highest education level: Not on file  Occupational History  . Not on file  Social Needs  . Financial resource strain: Not on file  . Food insecurity:    Worry: Not  on file    Inability: Not on file  . Transportation needs:    Medical: Not on file    Non-medical: Not on file  Tobacco Use  . Smoking status: Current Every Day Smoker  . Smokeless tobacco: Never Used  Substance and Sexual Activity  . Alcohol use: Not Currently  . Drug use: Not on file  . Sexual activity: Not Currently  Lifestyle  . Physical activity:    Days per week: Not on file    Minutes per session: Not on file  . Stress: Not on file  Relationships  . Social connections:    Talks on phone:  Not on file    Gets together: Not on file    Attends religious service: Not on file    Active member of club or organization: Not on file    Attends meetings of clubs or organizations: Not on file    Relationship status: Not on file  . Intimate partner violence:    Fear of current or ex partner: Not on file    Emotionally abused: Not on file    Physically abused: Not on file    Forced sexual activity: Not on file  Other Topics Concern  . Not on file  Social History Narrative  . Not on file  ,  reports that he has been smoking. He has never used smokeless tobacco. He reports that he drank alcohol.   Family History   His Family history is unknown by patient.   Allergies Not on File   Home Medications  Prior to Admission medications   Not on File     Critical care time: 43    The patient is critically ill with multiple organ systems failure and requires high complexity decision making for assessment and support, frequent evaluation and titration of therapies, application of advanced monitoring technologies and extensive interpretation of multiple databases.   Critical Care Time devoted to patient care services described in this note is  45  Minutes. This time reflects time of care of this signee Dr Koren Bound. This critical care time does not reflect procedure time, or teaching time or supervisory time of PA/NP/Med student/Med Resident etc but could involve care discussion  time.  Alyson Reedy, M.D. [facility] Pulmonary/Critical Care Medicine. Pager: [phone]. After hours pager: [phone].
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
## 2017-11-04 NOTE — Progress Notes (Signed)
Patient was transported to CT and back to 2M07 on ventilator with no complications.

## 2017-11-04 NOTE — Progress Notes (Signed)
[facility]Link Physician-Brief Progress Note Patient Name: Malik Brooks DOB: 01/01/1956 MRN: [id number]   Date of Service  11/04/2017  HPI/Events of Note  Head CT with parenchymal bleed. INR 1.98  eICU Interventions  Transfuse 2 units FFP.  Discussed with bedside nurse, family to be informed.     Intervention Category Intermediate Interventions: Bleeding - evaluation and treatment with blood products  Darl Pikes 11/04/2017, 8:42 PM

## 2017-11-04 NOTE — ED Triage Notes (Signed)
Pt brought in by [facility] from [facility] due to pt having a cardiac arrest. Per [facility], pt was given 2 pis, 300mg  of amio, and shocked x1. Pt in normal sinus at this time. Pt has levophed going at 6. Pt has left arm PICC double lumen. Pt received approximately 15 minutes of CPR. Pt has trach.

## 2017-11-04 NOTE — Progress Notes (Addendum)
Pharmacy Antibiotic Note  Malik Brooks is a 62 y.o. male admitted on 11/04/2017 with pneumonia.  Pharmacy has been consulted for vancomycin dosing. Pt is hypothermic and WBC is elevated at 20.8. SCr is well above baseline.   Plan: Vancomycin 2500mg  IV x 1 F/u SCr trend for further doses F/u renal fxn, C&S, clinical status and trough at [facility] F/u continuation of cefepime or other gram neg coverage  Height: 6' (182.9 cm) IBW/kg (Calculated) : 77.6  Temp (24hrs), Avg:96.2 F (35.7 C), Min:95.8 F (35.4 C), Max:96.5 F (35.8 C)  Recent Labs  Lab 11/04/17 1330 11/04/17 1332 11/04/17 1349  WBC 20.8*  --   --   CREATININE  --  5.20*  --   LATICACIDVEN  --   --  1.78    CrCl cannot be calculated (Unknown ideal weight.).    Not on File  Antimicrobials this admission: Vanc 10/21>> Cefepime x 1 10/21  Dose adjustments this admission: N/A  Microbiology results: Pending  Thank you for allowing pharmacy to be a part of this patient's care.  Rumbarger, Drake Leach 11/04/2017 2:22 PM    Addendum -SCr 5.2, eCrCl ~ 15 ml/min -Cefepime 1 g IV q24h -Vancomycin 1750 mg IV q48h -Follow renal fx, cultures, VR as needed  [name] 11/04/2017 3:55 PM

## 2017-11-04 NOTE — Progress Notes (Signed)
Pt transported on vent from ED to 2M07 without complications.  Unit RT at bedside.

## 2017-11-05 ENCOUNTER — Inpatient Hospital Stay (HOSPITAL_COMMUNITY): Payer: Medicare Other

## 2017-11-05 DIAGNOSIS — L89154 Pressure ulcer of sacral region, stage 4: Secondary | ICD-10-CM

## 2017-11-05 DIAGNOSIS — R9431 Abnormal electrocardiogram [ECG] [EKG]: Secondary | ICD-10-CM

## 2017-11-05 DIAGNOSIS — L8990 Pressure ulcer of unspecified site, unspecified stage: Secondary | ICD-10-CM | POA: Diagnosis present

## 2017-11-05 LAB — BLOOD CULTURE ID PANEL (REFLEXED)
ACINETOBACTER BAUMANNII: NOT DETECTED
CANDIDA KRUSEI: NOT DETECTED
CANDIDA PARAPSILOSIS: NOT DETECTED
CANDIDA TROPICALIS: NOT DETECTED
Candida albicans: NOT DETECTED
Candida glabrata: NOT DETECTED
ESCHERICHIA COLI: NOT DETECTED
Enterobacter cloacae complex: NOT DETECTED
Enterobacteriaceae species: NOT DETECTED
Enterococcus species: NOT DETECTED
HAEMOPHILUS INFLUENZAE: NOT DETECTED
KLEBSIELLA OXYTOCA: NOT DETECTED
KLEBSIELLA PNEUMONIAE: NOT DETECTED
Listeria monocytogenes: NOT DETECTED
METHICILLIN RESISTANCE: DETECTED — AB
Neisseria meningitidis: NOT DETECTED
PROTEUS SPECIES: NOT DETECTED
Pseudomonas aeruginosa: NOT DETECTED
SERRATIA MARCESCENS: NOT DETECTED
STAPHYLOCOCCUS AUREUS BCID: NOT DETECTED
STAPHYLOCOCCUS SPECIES: DETECTED — AB
Streptococcus agalactiae: NOT DETECTED
Streptococcus pneumoniae: NOT DETECTED
Streptococcus pyogenes: NOT DETECTED
Streptococcus species: NOT DETECTED

## 2017-11-05 LAB — CBC
HCT: 20.2 % — ABNORMAL LOW (ref 39.0–52.0)
HEMATOCRIT: 26.6 % — AB (ref 39.0–52.0)
Hemoglobin: 6.6 g/dL — CL (ref 13.0–17.0)
Hemoglobin: 8.5 g/dL — ABNORMAL LOW (ref 13.0–17.0)
MCH: 24.5 pg — AB (ref 26.0–34.0)
MCH: 25.1 pg — ABNORMAL LOW (ref 26.0–34.0)
MCHC: 32.7 g/dL (ref 30.0–36.0)
MCHC: 32 g/dL (ref 30.0–36.0)
MCV: 75.1 fL — ABNORMAL LOW (ref 80.0–100.0)
MCV: 78.5 fL — ABNORMAL LOW (ref 80.0–100.0)
NRBC: 0 % (ref 0.0–0.2)
PLATELETS: 232 10*3/uL (ref 150–400)
Platelets: 233 10*3/uL (ref 150–400)
RBC: 2.69 MIL/uL — AB (ref 4.22–5.81)
RBC: 3.39 MIL/uL — AB (ref 4.22–5.81)
RDW: 19.5 % — AB (ref 11.5–15.5)
RDW: 19 % — ABNORMAL HIGH (ref 11.5–15.5)
WBC: 13.7 10*3/uL — ABNORMAL HIGH (ref 4.0–10.5)
WBC: 11.7 10*3/uL — AB (ref 4.0–10.5)
nRBC: 0 % (ref 0.0–0.2)

## 2017-11-05 LAB — GLUCOSE, CAPILLARY
GLUCOSE-CAPILLARY: 156 mg/dL — AB (ref 70–99)
GLUCOSE-CAPILLARY: 95 mg/dL (ref 70–99)
GLUCOSE-CAPILLARY: 100 mg/dL — AB (ref 70–99)
GLUCOSE-CAPILLARY: 97 mg/dL (ref 70–99)
Glucose-Capillary: 101 mg/dL — ABNORMAL HIGH (ref 70–99)
Glucose-Capillary: 102 mg/dL — ABNORMAL HIGH (ref 70–99)
Glucose-Capillary: 111 mg/dL — ABNORMAL HIGH (ref 70–99)

## 2017-11-05 LAB — PREPARE FRESH FROZEN PLASMA
UNIT DIVISION: 0
Unit division: 0

## 2017-11-05 LAB — BASIC METABOLIC PANEL
ANION GAP: 14 (ref 5–15)
Anion gap: 14 (ref 5–15)
BUN: 109 mg/dL — ABNORMAL HIGH (ref 8–23)
BUN: 124 mg/dL — AB (ref 8–23)
CALCIUM: 6.3 mg/dL — AB (ref 8.9–10.3)
CHLORIDE: 100 mmol/L (ref 98–111)
CHLORIDE: 96 mmol/L — AB (ref 98–111)
CO2: 16 mmol/L — ABNORMAL LOW (ref 22–32)
CO2: 21 mmol/L — ABNORMAL LOW (ref 22–32)
CREATININE: 4.31 mg/dL — AB (ref 0.61–1.24)
Calcium: 7.4 mg/dL — ABNORMAL LOW (ref 8.9–10.3)
Creatinine, Ser: 5.09 mg/dL — ABNORMAL HIGH (ref 0.61–1.24)
GFR, EST AFRICAN AMERICAN: 15 mL/min — AB (ref >60)
GFR, EST AFRICAN AMERICAN: 13 mL/min — AB (ref >60)
GFR, EST NON AFRICAN AMERICAN: 13 mL/min — AB (ref >60)
GFR, EST NON AFRICAN AMERICAN: 11 mL/min — AB (ref >60)
Glucose, Bld: 121 mg/dL — ABNORMAL HIGH (ref 70–99)
Glucose, Bld: 112 mg/dL — ABNORMAL HIGH (ref 70–99)
Potassium: 3.8 mmol/L (ref 3.5–5.1)
Potassium: 4.9 mmol/L (ref 3.5–5.1)
SODIUM: 130 mmol/L — AB (ref 135–145)
SODIUM: 131 mmol/L — AB (ref 135–145)

## 2017-11-05 LAB — MAGNESIUM
MAGNESIUM: 1.9 mg/dL (ref 1.7–2.4)
MAGNESIUM: 2.5 mg/dL — AB (ref 1.7–2.4)

## 2017-11-05 LAB — BPAM FFP
BLOOD PRODUCT EXPIRATION DATE: 201910252359
BLOOD PRODUCT EXPIRATION DATE: 201910252359
ISSUE DATE / TIME: 201910212102
ISSUE DATE / TIME: 201910212300
UNIT TYPE AND RH: 5100
Unit Type and Rh: 9500

## 2017-11-05 LAB — POCT I-STAT 3, ART BLOOD GAS (G3+)
ACID-BASE DEFICIT: 3 mmol/L — AB (ref 0.0–2.0)
ACID-BASE DEFICIT: 5 mmol/L — AB (ref 0.0–2.0)
Acid-base deficit: 5 mmol/L — ABNORMAL HIGH (ref 0.0–2.0)
BICARBONATE: 22.3 mmol/L (ref 20.0–28.0)
BICARBONATE: 22.6 mmol/L (ref 20.0–28.0)
Bicarbonate: 19.4 mmol/L — ABNORMAL LOW (ref 20.0–28.0)
O2 Saturation: 100 %
O2 Saturation: 98 %
O2 Saturation: 93 %
PCO2 ART: 50 mmHg — AB (ref 32.0–48.0)
PH ART: 7.515 — AB (ref 7.350–7.450)
PO2 ART: 214 mmHg — AB (ref 83.0–108.0)
PO2 ART: 116 mmHg — AB (ref 83.0–108.0)
Patient temperature: 97.7
TCO2: 20 mmol/L — ABNORMAL LOW (ref 22–32)
TCO2: 24 mmol/L (ref 22–32)
TCO2: 24 mmol/L (ref 22–32)
pCO2 arterial: 24.1 mmHg — ABNORMAL LOW (ref 32.0–48.0)
pCO2 arterial: 47.3 mmHg (ref 32.0–48.0)
pH, Arterial: 7.279 — ABNORMAL LOW (ref 7.350–7.450)
pH, Arterial: 7.259 — ABNORMAL LOW (ref 7.350–7.450)
pO2, Arterial: 77 mmHg — ABNORMAL LOW (ref 83.0–108.0)

## 2017-11-05 LAB — TROPONIN I: TROPONIN I: 0.08 ng/mL — AB (ref <0.03)

## 2017-11-05 LAB — BLOOD PRODUCT ORDER (VERBAL) VERIFICATION

## 2017-11-05 LAB — PHOSPHORUS: PHOSPHORUS: 5.4 mg/dL — AB (ref 2.5–4.6)

## 2017-11-05 LAB — LACTIC ACID, PLASMA: LACTIC ACID, VENOUS: 0.8 mmol/L (ref 0.5–1.9)

## 2017-11-05 LAB — VANCOMYCIN, TROUGH: VANCOMYCIN TR: 22 ug/mL — AB (ref 15–20)

## 2017-11-05 LAB — HIV ANTIBODY (ROUTINE TESTING W REFLEX): HIV SCREEN 4TH GENERATION: NONREACTIVE

## 2017-11-05 LAB — MRSA PCR SCREENING: MRSA BY PCR: POSITIVE — AB

## 2017-11-05 LAB — PREPARE RBC (CROSSMATCH)

## 2017-11-05 MED ORDER — SODIUM CHLORIDE 0.9% IV SOLUTION: Freq: Once | INTRAVENOUS | Status: DC

## 2017-11-05 MED ORDER — MAGNESIUM SULFATE IN D5W 1-5 GM/100ML-% IV SOLN
1.0000 g | Freq: Once | INTRAVENOUS | Status: AC
  Administered 2017-11-05: 1 g via INTRAVENOUS
  Filled 2017-11-05: qty 100

## 2017-11-05 MED ORDER — PRO-STAT SUGAR FREE PO LIQD
30.0000 mL | Freq: Four times a day (QID) | ORAL | Status: DC
  Administered 2017-11-05 – 2017-11-12 (×28): 30 mL
  Filled 2017-11-05 (×27): qty 30

## 2017-11-05 MED ORDER — VITAL HIGH PROTEIN PO LIQD
1000.0000 mL | ORAL | Status: DC
  Administered 2017-11-05 – 2017-11-12 (×11): 1000 mL
  Filled 2017-11-05 (×2): qty 1000

## 2017-11-05 MED ORDER — MUPIROCIN 2 % EX OINT
1.0000 "application " | TOPICAL_OINTMENT | Freq: Two times a day (BID) | CUTANEOUS | Status: AC
  Administered 2017-11-05 – 2017-11-09 (×10): 1 via NASAL
  Filled 2017-11-05 (×2): qty 22

## 2017-11-05 MED ORDER — CHLORHEXIDINE GLUCONATE CLOTH 2 % EX PADS
6.0000 | MEDICATED_PAD | Freq: Every day | CUTANEOUS | Status: AC
  Administered 2017-11-05 – 2017-11-09 (×5): 6 via TOPICAL

## 2017-11-05 MED ORDER — POTASSIUM CHLORIDE 20 MEQ/15ML (10%) PO SOLN
40.0000 meq | Freq: Once | ORAL | Status: AC
  Administered 2017-11-05: 40 meq via ORAL
  Filled 2017-11-05: qty 30

## 2017-11-05 MED ORDER — MAGNESIUM SULFATE 2 GM/50ML IV SOLN: 2.0000 g | Freq: Once | INTRAVENOUS | Status: DC

## 2017-11-05 NOTE — Progress Notes (Signed)
Family in to visit patient. Patient woke up for family and is following simple commands. Patient opened mouth and squeezed this RN hand on command. At beginning of shift patient was not following commands or responding to verbal stimuli only painful stimuli.  Will continue to monitor.  Genelle Bal, RN
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
## 2017-11-05 NOTE — Progress Notes (Signed)
CRITICAL VALUE ALERT  Critical Value:  Vanc Trough 22  Date & Time Notied:  11/05/2017 2038  Provider Notified: Pharmacist Lynden Ang

## 2017-11-05 NOTE — Progress Notes (Signed)
Initial Nutrition Assessment  DOCUMENTATION CODES:   Morbid obesity  INTERVENTION:    Vital High Protein at 55 ml/h (1320 ml per day) via PEG  Pro-stat 30 ml QID  Provides 1720 kcal, 176 gm protein, 1104 ml free water daily  NUTRITION DIAGNOSIS:   Inadequate oral intake related to inability to eat as evidenced by NPO status.  GOAL:   Provide needs based on ASPEN/SCCM guidelines  MONITOR:   Vent status, TF tolerance, Labs, I & O's, Skin  REASON FOR ASSESSMENT:   Ventilator, Consult Enteral/tube feeding initiation and management  ASSESSMENT:   62 yo male with PMH of COPD, CAD, DM, HTN, A fib, paraplegia, trach, and PEG who was admitted on 10/21 from [facility] s/p cardiac arrest.   Discussed patient with RN today. Family reversed DNR, so patient is now a full code. He has a PEG, but unsure of usual TF regimen. Received MD Consult for TF initiation and management.  Patient is currently intubated on ventilator support MV: 9.2 L/min Temp (24hrs), Avg:97.9 F (36.6 C), Min:96.3 F (35.7 C), Max:98.2 F (36.8 C)   Labs reviewed. Sodium 130 (L), phosphorus 5.4 (H), BUN 109 (H), creatinine 4.31 (H), Hgb 6.6 (L) CBG's: 101-111 Medications reviewed and include novolog, magnesium sulfate, levophed.    NUTRITION - FOCUSED PHYSICAL EXAM:    Most Recent Value  Orbital Region  No depletion  Upper Arm Region  No depletion  Thoracic and Lumbar Region  Unable to assess  Buccal Region  No depletion  Temple Region  No depletion  Clavicle Bone Region  No depletion  Clavicle and Acromion Bone Region  No depletion  Scapular Bone Region  Unable to assess  Dorsal Hand  Unable to assess  Patellar Region  Unable to assess  Anterior Thigh Region  Unable to assess  Posterior Calf Region  Unable to assess  Edema (RD Assessment)  Unable to assess  Hair  Reviewed  Eyes  Unable to assess  Mouth  Unable to assess  Skin  Reviewed  Nails  Unable to assess       Diet Order:   Diet  Order    None      EDUCATION NEEDS:   No education needs have been identified at this time  Skin:  Skin Assessment: Skin Integrity Issues: Skin Integrity Issues:: Stage IV, Other (Comment) Stage IV: sacrum Other: L chest wound  Last BM:  10/22 (type 7)  Height:   Ht Readings from Last 1 Encounters:  11/04/17 5\' 8"  (1.727 m)    Weight:   Wt Readings from Last 1 Encounters:  11/05/17 128.8 kg    Ideal Body Weight:  70 kg  BMI:  Body mass index is 43.17 kg/m.  Estimated Nutritional Needs:   Kcal:  1415-1800  Protein:  175 gm  Fluid:  2 L    Joaquin Courts, RD, LDN, CNSC Pager [phone] After Hours Pager [phone]

## 2017-11-05 NOTE — Progress Notes (Signed)
CRITICAL VALUE ALERT  Critical Value:  Troponin 0.08  Date & Time Notied:  11/05/2017 2050  Provider Notified: Sung Amabile MD

## 2017-11-05 NOTE — Progress Notes (Signed)
Echocardiogram 2D Echocardiogram has been performed.  11/05/2017 5:31 PM Gertie Fey, MHA, RVT, RDCS, RDMS

## 2017-11-05 NOTE — Progress Notes (Signed)
PHARMACY - PHYSICIAN COMMUNICATION CRITICAL VALUE ALERT - BLOOD CULTURE IDENTIFICATION (BCID)  Malik Brooks is an 62 y.o. male who presented to [facility] on 11/04/2017 with a chief complaint of cardiac arrest  Assessment:  1 out of 5 blood cultures with GPCs identified as Coag negative staph that was MEC-A positive.    Name of physician (or Provider) Contacted: Dr. Kendrick Fries  Current antibiotics: Currently receiving Vanc and Cefepime  Changes to prescribed antibiotics recommended:   Likely a contaminant; however, given significant decubitus ulcer will continue current therapy and narrow when appropriate.  Results for orders placed or performed during the hospital encounter of 11/04/17  Blood Culture ID Panel (Reflexed) (Collected: 11/04/2017  1:22 PM)  Result Value Ref Range   Enterococcus species NOT DETECTED NOT DETECTED   Listeria monocytogenes NOT DETECTED NOT DETECTED   Staphylococcus species DETECTED (A) NOT DETECTED   Staphylococcus aureus (BCID) NOT DETECTED NOT DETECTED   Methicillin resistance DETECTED (A) NOT DETECTED   Streptococcus species NOT DETECTED NOT DETECTED   Streptococcus agalactiae NOT DETECTED NOT DETECTED   Streptococcus pneumoniae NOT DETECTED NOT DETECTED   Streptococcus pyogenes NOT DETECTED NOT DETECTED   Acinetobacter baumannii NOT DETECTED NOT DETECTED   Enterobacteriaceae species NOT DETECTED NOT DETECTED   Enterobacter cloacae complex NOT DETECTED NOT DETECTED   Escherichia coli NOT DETECTED NOT DETECTED   Klebsiella oxytoca NOT DETECTED NOT DETECTED   Klebsiella pneumoniae NOT DETECTED NOT DETECTED   Proteus species NOT DETECTED NOT DETECTED   Serratia marcescens NOT DETECTED NOT DETECTED   Haemophilus influenzae NOT DETECTED NOT DETECTED   Neisseria meningitidis NOT DETECTED NOT DETECTED   Pseudomonas aeruginosa NOT DETECTED NOT DETECTED   Candida albicans NOT DETECTED NOT DETECTED   Candida glabrata NOT DETECTED NOT DETECTED   Candida  krusei NOT DETECTED NOT DETECTED   Candida parapsilosis NOT DETECTED NOT DETECTED   Candida tropicalis NOT DETECTED NOT DETECTED   Jeanella Cara, PharmD, FCCM Clinical Pharmacist Please see AMION for all Pharmacists' Contact Phone Numbers 11/05/2017, 5:32 PM

## 2017-11-05 NOTE — Progress Notes (Signed)
eLink Physician-Brief Progress Note Patient Name: Kass Herberger DOB: 1955-11-20 MRN: [id number]   Date of Service  11/05/2017  HPI/Events of Note  Notified of ABG result, respiratory alkalosis on PC 28 (TV800s) rate 20 (not breathing over the set rate.   H/H 6.6/202. From 8.8/26.6 oozing from sacral wound, still on norepinephrine.  Pain when right arms is touched.  eICU Interventions  Decrease PC 15 (TV500s) rate 15 Transfuse 2 unit PRBC Right wrist xray     Intervention Category Major Interventions: Airway management Intermediate Interventions: Bleeding - evaluation and treatment with blood products;Pain - evaluation and management  Rosalie Gums Olanda Downie 11/05/2017, 5:47 AM

## 2017-11-05 NOTE — Progress Notes (Signed)
Xray ordered for patient's R wrist. Patient experiencing excruciating pain with any movement of right wrist. Levo had to be increased due to patient's BP decreasing into 80's systolic. Vent settings changed per Elink MD order due to ABG. Pt remains alert and is still following commands. Sacral wound is very deep and has been packed with a Kerlix soaked in NS and covered with ABD pads. HR this AM is elevated to 100-115. Pt does seem to be in discomfort after dressing change and having to be repositioned in bed. Wound nurse consulted.  Genelle Bal, RN

## 2017-11-05 NOTE — Progress Notes (Signed)
CRITICAL VALUE ALERT  Critical Value:  hgb 6.6  Date & Time Notied:  11/05/2017 0448  Provider Notified: A[place]ura  Orders Received/Actions taken: 2 units PRBC

## 2017-11-05 NOTE — Progress Notes (Signed)
CSW received consult as pt is from [facility] and per report family is not interested in pt returning to facility. CSW went to speak with pt's family at bedside however no one at bedside at this time. CSW will follow up at the end of the day to see if family has arrived to speak with them regarding alternative placement options for pt.    Malik Brooks, MSW, LCSW-A Emergency Department Clinical Social Worker [phone]

## 2017-11-06 ENCOUNTER — Inpatient Hospital Stay (HOSPITAL_COMMUNITY): Payer: Medicare Other

## 2017-11-06 DIAGNOSIS — R52 Pain, unspecified: Secondary | ICD-10-CM

## 2017-11-06 DIAGNOSIS — N179 Acute kidney failure, unspecified: Secondary | ICD-10-CM | POA: Diagnosis present

## 2017-11-06 LAB — BASIC METABOLIC PANEL
Anion gap: 15 (ref 5–15)
BUN: 124 mg/dL — ABNORMAL HIGH (ref 8–23)
CALCIUM: 7.6 mg/dL — AB (ref 8.9–10.3)
CO2: 19 mmol/L — ABNORMAL LOW (ref 22–32)
CREATININE: 5.06 mg/dL — AB (ref 0.61–1.24)
Chloride: 98 mmol/L (ref 98–111)
GFR calc Af Amer: 13 mL/min — ABNORMAL LOW (ref >60)
GFR, EST NON AFRICAN AMERICAN: 11 mL/min — AB (ref >60)
Glucose, Bld: 111 mg/dL — ABNORMAL HIGH (ref 70–99)
Potassium: 4.6 mmol/L (ref 3.5–5.1)
Sodium: 132 mmol/L — ABNORMAL LOW (ref 135–145)

## 2017-11-06 LAB — POCT I-STAT 3, ART BLOOD GAS (G3+)
Acid-base deficit: 4 mmol/L — ABNORMAL HIGH (ref 0.0–2.0)
BICARBONATE: 21.8 mmol/L (ref 20.0–28.0)
O2 Saturation: 94 %
PCO2 ART: 43.5 mmHg (ref 32.0–48.0)
PO2 ART: 76 mmHg — AB (ref 83.0–108.0)
Patient temperature: 97.6
TCO2: 23 mmol/L (ref 22–32)
pH, Arterial: 7.306 — ABNORMAL LOW (ref 7.350–7.450)

## 2017-11-06 LAB — TYPE AND SCREEN
ABO/RH(D): O POS
ANTIBODY SCREEN: NEGATIVE
UNIT DIVISION: 0
Unit division: 0
Unit division: 0
Unit division: 0

## 2017-11-06 LAB — GLUCOSE, CAPILLARY
GLUCOSE-CAPILLARY: 107 mg/dL — AB (ref 70–99)
GLUCOSE-CAPILLARY: 111 mg/dL — AB (ref 70–99)
GLUCOSE-CAPILLARY: 136 mg/dL — AB (ref 70–99)
Glucose-Capillary: 103 mg/dL — ABNORMAL HIGH (ref 70–99)
Glucose-Capillary: 107 mg/dL — ABNORMAL HIGH (ref 70–99)
Glucose-Capillary: 116 mg/dL — ABNORMAL HIGH (ref 70–99)
Glucose-Capillary: 142 mg/dL — ABNORMAL HIGH (ref 70–99)

## 2017-11-06 LAB — CBC
HCT: 27.9 % — ABNORMAL LOW (ref 39.0–52.0)
Hemoglobin: 8.8 g/dL — ABNORMAL LOW (ref 13.0–17.0)
MCH: 24.9 pg — ABNORMAL LOW (ref 26.0–34.0)
MCHC: 31.5 g/dL (ref 30.0–36.0)
MCV: 78.8 fL — ABNORMAL LOW (ref 80.0–100.0)
PLATELETS: 226 10*3/uL (ref 150–400)
RBC: 3.54 MIL/uL — ABNORMAL LOW (ref 4.22–5.81)
RDW: 19.3 % — AB (ref 11.5–15.5)
WBC: 12.5 10*3/uL — ABNORMAL HIGH (ref 4.0–10.5)
nRBC: 0 % (ref 0.0–0.2)

## 2017-11-06 LAB — BPAM RBC
BLOOD PRODUCT EXPIRATION DATE: 201911142359
BLOOD PRODUCT EXPIRATION DATE: 201911152359
BLOOD PRODUCT EXPIRATION DATE: 201911182359
Blood Product Expiration Date: 201911212359
ISSUE DATE / TIME: 201910211413
ISSUE DATE / TIME: 201910220822
ISSUE DATE / TIME: 201910211413
ISSUE DATE / TIME: 201910220603
UNIT TYPE AND RH: 5100
UNIT TYPE AND RH: 9500
Unit Type and Rh: 5100
Unit Type and Rh: 9500

## 2017-11-06 LAB — TROPONIN I
TROPONIN I: 0.05 ng/mL — AB (ref <0.03)
Troponin I: 0.05 ng/mL (ref <0.03)
Troponin I: 0.05 ng/mL (ref <0.03)

## 2017-11-06 LAB — VANCOMYCIN, RANDOM: Vancomycin Rm: 21

## 2017-11-06 LAB — ECHOCARDIOGRAM COMPLETE
HEIGHTINCHES: 68 in
Weight: 4543.24 oz

## 2017-11-06 LAB — LACTIC ACID, PLASMA: Lactic Acid, Venous: 0.7 mmol/L (ref 0.5–1.9)

## 2017-11-06 LAB — MAGNESIUM: Magnesium: 2.5 mg/dL — ABNORMAL HIGH (ref 1.7–2.4)

## 2017-11-06 MED ORDER — MIDAZOLAM HCL 2 MG/2ML IJ SOLN
2.0000 mg | Freq: Once | INTRAMUSCULAR | Status: AC
  Administered 2017-11-06: 2 mg via INTRAVENOUS

## 2017-11-06 MED ORDER — MIDAZOLAM HCL 2 MG/2ML IJ SOLN
INTRAMUSCULAR | Status: AC
  Administered 2017-11-06: 2 mg via INTRAVENOUS
  Filled 2017-11-06: qty 2

## 2017-11-06 MED ORDER — MIDODRINE HCL 5 MG PO TABS
10.0000 mg | ORAL_TABLET | Freq: Three times a day (TID) | ORAL | Status: DC
  Administered 2017-11-06 – 2017-11-11 (×14): 10 mg
  Filled 2017-11-06 (×15): qty 2

## 2017-11-06 MED ORDER — FENTANYL CITRATE (PF) 100 MCG/2ML IJ SOLN
100.0000 ug | Freq: Once | INTRAMUSCULAR | Status: AC
  Administered 2017-11-06: 100 ug via INTRAVENOUS
  Filled 2017-11-06: qty 2

## 2017-11-06 MED FILL — Norepinephrine-Dextrose IV Solution 4 MG/250ML-5%: INTRAVENOUS | Qty: 250 | Status: AC

## 2017-11-06 NOTE — Progress Notes (Signed)
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  Start midodrine to facilitate weaning off of levophed.  Heber Wadena, MD [place] PCCM Pager: [phone] Cell: [phone] After 3pm or if no response, call [phone]

## 2017-11-06 NOTE — Progress Notes (Signed)
Pt came to MRI for exam.  Pt was in scanner and he started moving hands and head.  Pulled pt out of scanner when he said he did not want to do exam.  RN and RT present.

## 2017-11-06 NOTE — Progress Notes (Signed)
Pharmacy Antibiotic Note  Malik Brooks is a 62 y.o. male admitted on 11/04/2017 s/p cardiac arrest and found to have pneumonia.  Pharmacy has been consulted for vancomycin and cefepime dosing. Blood cultures from 10/21 now growing staph epidermidis bacteremia. Today patient is afebrile, WBC count has trended up from yesterday from 11.7 to 12.5, and Scr remains elevated at 5.06. Due to patient's past medical history of paraplegia and current elevated Scr, the utilization of vancomycin random levels are guiding therapy. Today patient's Vancomycin random level was supratherapeutic at 21 mcg/mL. (Patient specific ke 0.005 and t 1/2 of ~130hrs)  Of note, patient's PICC line placed prior to admission on 10/21 planned to be removed today to eliminate sources of infection.  Plan: Hold vancomycin dosing for today Continue Cefepime 1G IV Q24H F/u SCr trend for further doses F/u renal fxn, C&S, clinical status  Height: 5\' 8"  (172.7 cm) Weight: 290 lb 5.5 oz (131.7 kg) IBW/kg (Calculated) : 68.4  Temp (24hrs), Avg:97.8 F (36.6 C), Min:97.6 F (36.4 C), Max:98.3 F (36.8 C)  Recent Labs  Lab 11/04/17 1330 11/04/17 1332 11/04/17 1349 11/05/17 0435 11/05/17 2001 11/06/17 0358 11/06/17 0450  WBC 20.8*  --   --  13.7* 11.7* 12.5*  --   CREATININE 4.62* 5.20*  --  4.31* 5.09* 5.06*  --   LATICACIDVEN  --   --  1.78  --  0.8  --  0.7  VANCOTROUGH  --   --   --   --  22*  --   --   VANCORANDOM  --   --   --   --   --   --  21    Estimated Creatinine Clearance: 19.5 mL/min (A) (by C-G formula based on SCr of 5.06 mg/dL (H)).    Not on File  Antimicrobials this admission: Vanc 10/21 >> Cefepime 10/21 >>  Dose adjustments this admission: N/A  Microbiology results: Bcx 10/21: BCID Staph spp. Methicillin resistant MRSA PCR 10/21: positive  Thank you for allowing pharmacy to be a part of this patient's care.  Lenord Carbo, PharmD PGY1 Pharmacy Resident Phone: [phone]  Please check AMION for all [facility] Pharmacy phone numbers 11/06/2017 1:06 PM

## 2017-11-06 NOTE — Progress Notes (Signed)
Orthopedic Tech Progress Note Patient Details:  Malik Brooks Mar 26, 1955 [id number]  Ortho Devices Type of Ortho Device: Velcro wrist splint [facility]o Device/Splint [facility]Splint Interventions: Application   Post Interventions Patient Tolerated: Well Instructions Provided: Care of device   Nikki Dom 11/06/2017, 4:38 PM

## 2017-11-06 NOTE — Consult Note (Addendum)
Neurology Consultation  Reason for Consult: Intracranial bleed Referring Physician: Mcquaid  CC: Intracranial bleed on  CT  History is obtained from: Chart as patient has a tracheostomy  HPI: Malik Brooks is a 62 y.o. male with paraplegia, hypertension, diabetes, CAD, atrial fibrillation.  Per notes patient is a resident at [facility] with chronic trach that had some respiratory difficulties and suffered cardiac arrest.  Patient was a DNR but received CPR, amiodarone and 2 rounds of epi and shocked with a total downtime of 15 minutes.  Apparently at [facility] the arrest was thought to be a respiratory code and patient was resuscitated despite being a DNR.  CT of head was obtained as a repeat for possible toxic injury however upon obtaining CT did show 2.7 left frontal intraparenchymal hemorrhage with no significant mass-effect.  For that reason neurology was consulted.  Although patient cannot talk when asked if he is doing okay he pointed to his chest and said it hurts.  He stated he broke his right arm and cannot move it.  He was able to count my fingers and shake yes or no to my questions     LKW: Unknown tpa given?: no, intracranial bleed and no last known normal Premorbid modified Rankin scale (mRS): 4   ICH Score: 0   ROS:  Unable to obtain due to altered mental status having a tracheotomy  Past Medical History:  Diagnosis Date  . Acute on chronic respiratory failure with hypoxia ([place]) 06/07/2017  . Atelectasis, left 06/07/2017  . Chronic atrial fibrillation   . 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 Coronary artery disease   . Diabetes mellitus ([place])   . DVT (deep venous thrombosis) ([place])   . Hypertension   . Paraplegia ([place])      Family History  Family history unknown: Yes     Social History:   reports that he has been smoking. He has never used smokeless tobacco. He reports that he drank alcohol. His drug history is not on file.  Medications  Current  Facility-Administered Medications:  .  0.9 %  sodium chloride infusion (Manually program via Guardrails IV Fluids), , Intravenous, Once, Salvadore Dom E, NP .  0.9 %  sodium chloride infusion (Manually program via Guardrails IV Fluids), , Intravenous, Once, A[place]ura, Emily T, MD .  0.9 %  sodium chloride infusion (Manually program via Guardrails IV Fluids), , Intravenous, Once, [place], Emily T, MD .  0.9 %  sodium chloride infusion, , Intravenous, Continuous, Erick Colace, NP, Last Rate: 100 mL/hr at 11/06/17 0801 .  0.9 %  sodium chloride infusion, 250 mL, Intravenous, Continuous, Erick Colace, NP, Last Rate: 10 mL/hr at 11/06/17 0500 .  ceFEPIme (MAXIPIME) 1 g in sodium chloride 0.9 % 100 mL IVPB, 1 g, Intravenous, Q24H, Masters, Jake Church, [facility], Stopped at 11/05/17 1437 .  chlorhexidine gluconate (MEDLINE KIT) (PERIDEX) 0.12 % solution 15 mL, 15 mL, Mouth Rinse, BID, Rush Farmer, MD, 15 mL at 11/06/17 0757 .  Chlorhexidine Gluconate Cloth 2 % PADS 6 each, 6 each, Topical, Q0600, Rush Farmer, MD, 6 each at 11/06/17 0517 .  famotidine (PEPCID) IVPB 20 mg premix, 20 mg, Intravenous, Q24H, Corinda Gubler, RPH, Stopped at 11/05/17 2215 .  feeding supplement (PRO-STAT SUGAR FREE 64) liquid 30 mL, 30 mL, Per Tube, QID, Magdalen Spatz, NP, 30 mL at 11/06/17 0907 .  feeding supplement (VITAL HIGH PROTEIN) liquid 1,000 mL, 1,000 mL, Per Tube, Q24H, Eric Form  F, NP, 1,000 mL at 11/06/17 0500 .  fentaNYL (SUBLIMAZE) injection 100 mcg, 100 mcg, Intravenous, Q2H PRN, Erick Colace, NP, 100 mcg at 11/06/17 1222 .  insulin aspart (novoLOG) injection 0-15 Units, 0-15 Units, Subcutaneous, Q4H, Erick Colace, NP, 2 Units at 11/06/17 1112 .  ipratropium-albuterol (DUONEB) 0.5-2.5 (3) MG/3ML nebulizer solution 3 mL, 3 mL, Nebulization, Q6H, Salvadore Dom E, NP, 3 mL at 11/06/17 0742 .  MEDLINE mouth rinse, 15 mL, Mouth Rinse, 10 times per day, Rush Farmer, MD, 15 mL at 11/06/17  0908 .  mupirocin ointment (BACTROBAN) 2 % 1 application, 1 application, Nasal, BID, Rush Farmer, MD, 1 application at 03/00/92 0908 .  norepinephrine (LEVOPHED) '4mg'$  in D5W 247m premix infusion, 5-10 mcg/min, Intravenous, Titrated, YRush Farmer MD, Last Rate: 26.3 mL/hr at 11/06/17 1114, 7 mcg/min at 11/06/17 1114 .  vancomycin variable dose per unstable renal function (pharmacist dosing), , Does not apply, See admin instructions, BErick Colace NP   Exam: Current vital signs: BP (!) 101/50   Pulse 65   Temp 98.3 F (36.8 C) (Oral)   Resp 19   Ht '5\' 8"'$  (1.727 m)   Wt 131.7 kg   SpO2 98%   BMI 44.15 kg/m  Vital signs in last 24 hours: Temp:  [97.6 F (36.4 C)-98.3 F (36.8 C)] 98.3 F (36.8 C) (10/23 1100) Pulse Rate:  [62-104] 65 (10/23 1300) Resp:  [14-23] 19 (10/23 1300) BP: (85-116)/(36-64) 101/50 (10/23 1300) SpO2:  [91 %-99 %] 98 % (10/23 1300) FiO2 (%):  [40 %-50 %] 40 % (10/23 1200) Weight:  [131.7 kg] 131.7 kg (10/23 0500)  Physical Exam  Constitutional: Appears well-developed and well-nourished.  Psych: Affect appropriate to situation Eyes: No scleral injection HENT: No OP obstrucion Head: Normocephalic.  Cardiovascular: Normal rate and regular rhythm.  Respiratory: Effort normal, non-labored breathing GI: Soft.  No distension. There is no tenderness.  Skin: WDI  Neuro: Mental Status: Patient is awake, alert, oriented to person, place, month, year, and situation. Cannot talk secondary to tracheostomy however he does nod his head yes and no Cranial Nerves: II: Visual Fields are full. Pupils are equal, round, and reactive to light.   III,IV, VI: EOMI without ptosis or diploplia.  V: Facial sensation is symmetric to temperature VII: Facial movement is symmetric.    Motor: 5/5 with his left arm cannot move his right arm secondary to fracture.  Unable to lift both legs off the bed.  Unable to wiggle his toes. Sensory: Patient symmetrical  bilaterally Deep Tendon Reflexes: Deep tendon reflexes depressed significantly in the upper remedies with no deep tendon reflexes in the lower extremities  plantars: [place] Cerebellar: Unable to obtain     Labs I have reviewed labs in epic and the results pertinent to this consultation are:   CBC    Component Value Date/Time   WBC 12.5 (H) 11/06/2017 0358   RBC 3.54 (L) 11/06/2017 0358   HGB 8.8 (L) 11/06/2017 0358   HCT 27.9 (L) 11/06/2017 0358   PLT 226 11/06/2017 0358   MCV 78.8 (L) 11/06/2017 0358   MCH 24.9 (L) 11/06/2017 0358   MCHC 31.5 11/06/2017 0358   RDW 19.3 (H) 11/06/2017 0358   LYMPHSABS 0.7 11/04/2017 1330   MONOABS 0.4 11/04/2017 1330   EOSABS 0.1 11/04/2017 1330   BASOSABS 0.0 11/04/2017 1330    CMP     Component Value Date/Time   NA 132 (L) 11/06/2017 0358   K 4.6  11/06/2017 0358   CL 98 11/06/2017 0358   CO2 19 (L) 11/06/2017 0358   GLUCOSE 111 (H) 11/06/2017 0358   BUN 124 (H) 11/06/2017 0358   CREATININE 5.06 (H) 11/06/2017 0358   CALCIUM 7.6 (L) 11/06/2017 0358   PROT 4.6 (L) 11/04/2017 1330   ALBUMIN 1.3 (L) 11/04/2017 1330   AST 11 (L) 11/04/2017 1330   ALT 5 11/04/2017 1330   ALKPHOS 70 11/04/2017 1330   BILITOT 0.6 11/04/2017 1330   GFRNONAA 11 (L) 11/06/2017 0358   GFRAA 13 (L) 11/06/2017 0358    Lipid Panel  No results found for: CHOL, TRIG, HDL, CHOLHDL, VLDL, LDLCALC, LDLDIRECT   Imaging I have reviewed the images obtained:  CT-scan of the brain-2.7 left frontal intraparenchymal hemorrhage with no significant mass-effect  MRI examination of the brain- ending  Etta Quill PA-C [facility] [phone]  M-F  (9:00 am- 5:00 PM)  11/06/2017, 2:57 PM     Assessment: This 62 year old male status post cardiac arrest now presenting with a 2.7 left frontal intraparenchymal hemorrhage noted on CT head performed on 10/21.  Patient not on anticoagulation due to hematuria  Frontal hemorrhage versus hemorrhagic  conversion of ischemic stroke  Recommendations: -MRI of brain w/o contrast - Blood pressure parametes less than 140/90 mmHg --Echocardiogram  - No antiplatelets or anticoagulants at this time -- Neurochecks q4h -- Stroke swallow screen when appropriate    NEUROHOSPITALIST ADDENDUM Performed a face to face diagnostic evaluation.   I have reviewed the contents of history and physical exam as documented by PA/ARNP/Resident and agree with above documentation.  I have discussed and formulated the above plan as documented. Edits to the note have been made as needed.  62 year old male with paraplegia, atrial fibrillation, hematuria [facility] after suffering cardiac arrest.  He underwent CPR in the nursing home with return to circulation.  A CT head performed showed a small left frontal hemorrhage.  Patient appears to be asymptomatic from the hemorrhage.  Wonder whether this is a hemorrhagic infarct rather than primary ICH patient not hypertensive.  An MRI brain would be better for determination.    Karena Addison Hyrum Shaneyfelt MD [facility] [phone]   If 7pm to 7am, please call on call as listed on AMION.

## 2017-11-06 NOTE — Progress Notes (Signed)
[facility] PCCM  I met with the patient's family again this afternoon to discuss code status and the fact that his condition is still critical.  We discussed the nature of CPR and shocks and I advised that those would not be medically beneficial if he had a cardiac arrest.  They voiced understanding and agreed to making him no code blue in the event of cardiac arrest but will continue the ventilator and vasopressors for now.  No hemodialysis.  Roselie Awkward, MD [place] PCCM Pager: [phone] Cell: [phone] After 3pm or if no response, call [phone]

## 2017-11-06 NOTE — Progress Notes (Signed)
Pt transported to MRI via bed on monitor and vent with RT and transport.  On arrival to MRI 2mg  Versed given IV.  Pt was placed on MRI stretcher.  Pt was educated on procedure and educated that he would need to remain still for approx 20 minutes for the test to be complete.  Pt was placed in MRI machine but was continuously moving.  This RN, MRI tech and RT went to pt to verbally try to calm him down and pt refused and repeatedly mouthed to get him out of there.  Test was terminated without completion.  Pt returned to room, 2M07, via bed, on monitor and vent with this RN, RT and transport.

## 2017-11-06 NOTE — Progress Notes (Addendum)
CSW spoke with pt's niece Malik Brooks via phone to see if she would be interested in setting up a meeting with [facility] before CSW began another vent/snf search. Malik Brooks expressed that she has already reached out to [employer] at [facility] to inform her that she wants pt to go home. CSW advised Malik Brooks that if they are wanting pt to be home then CSW would ensure that pt is discharged back to [facility] once medically stable and they can continue to work on getting pt home with [facility]'s Child psychotherapist. Malik Brooks agreeable to plan at this time.   CSW reached out to [facility] to see when beds would be available voicemail for [name] at this time. CSW will continue to follow for further needs.    Malik Brooks, MSW, LCSW-A Emergency Department Clinical Social Worker [phone]

## 2017-11-06 NOTE — Progress Notes (Signed)
Interim progress note:  Consulted Neurology regarding small intracranial hematoma.  F/u MRI wo contrast requested and ordered.  Will hold all anticoagulation and antiplatelet pending neuro guidance likely s/p MRI read.  Patient and CCM attending notified of plan and in agreement.  -Dr. Parke Simmers

## 2017-11-06 NOTE — Care Management (Addendum)
CM discussed LTACH with attending.  Pt now having renal failure - referral not appropriate at this time  Pt is from [facility] - pt is chronic trach now with resp difficulties requiring continuous vent.  CM requested LTACH to review chart to determine if pt may be appropriate for [facility] discharge
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
## 2017-11-06 NOTE — Clinical Social Work Note (Signed)
Clinical Social Work Assessment  Patient Details  Name: Malik Brooks MRN: [id number] Date of Birth: 06-21-1955  Date of referral:  11/06/17               Reason for consult:  Facility Placement                Permission sought to share information with:  Family Supports Permission granted to share information::  Yes, Verbal Permission Granted  Name::     [name]  Agency::  family   Relationship::  niece   Contact Information:  [name]  Housing/Transportation Living arrangements for the past 2 months:  Skilled Nursing Facility([facility]) Source of Information:  Other (Comment Required)(pt's niece) Patient Interpreter Needed:  None Criminal Activity/Legal Involvement Pertinent to Current Situation/Hospitalization:  No - Comment as needed Significant Relationships:  Other Family Members Lives with:  Facility Resident Do you feel safe going back to the place where you live?  No Need for family participation in patient care:  Yes (Comment)  Care giving concerns:  CSW consulted as pt's niece Leandro Reasoner raised concerns of not wanting pt to return to [facility] at the time of discharge.    Social Worker assessment / plan:  CSW spoke with pt and niece at bedside. CSW was informed that pt is from [facility] where pt and family are all unpleased with the care that pt has been getting at this facility. CSW was informed that pt's family has hoped of getting pt home and asked if CSW would assist with this. CSW advised pt and niece that if this was their goal they would need to speak with MD or RNCM about this as CSW works with SNF placements and has very limited information on that aspect of care. Family agreeable.   CSW did advise family that usually when pt's are admitted from a facility they return to that facility and get placed into another facility by the current facility. CSW advised family that CSW would look for other facilities to take pt including out of state at this  time. CSW also verbalized to pt and family that if no facility has been found by the time pt is said to be medically stable for discharge then pt would have to go back to [facility] and work with admission from [facility] on getting pt placed into another facility. Family agreeable at this time.   Employment status:  Disabled (Comment on whether or not currently receiving Disability) Insurance information:  Medicare PT Recommendations:  Not assessed at this time Information / Referral to community resources:  Skilled Nursing Facility  Patient/Family's Response to care:  Family appeared to be understanding and agreeable to plan of care at this time.   Patient/Family's Understanding of and Emotional Response to Diagnosis, Current Treatment, and Prognosis:  No further questions or concerns have been presented to CSW at this time.   Emotional Assessment Appearance:  Appears stated age Attitude/Demeanor/Rapport:  Crying Affect (typically observed):  Sad Orientation:  Oriented to Self, Oriented to Situation, Oriented to Place, Oriented to  Time Alcohol / Substance use:  Not Applicable Psych involvement (Current and /or in the community):  No (Comment)  Discharge Needs  Concerns to be addressed:  Patient refuses services(family is not wanting pt to go back to [facility]. ) Readmission within the last 30 days:  No Current discharge risk:  Dependent with Mobility Barriers to Discharge:  Continued Medical Work up   [employer], LCSWA 11/06/2017, 9:58 AM

## 2017-11-06 NOTE — Progress Notes (Signed)
Patient was transported to MRI on ventilator.  Procedure was terminate due to Pt agitation while in MRI and wanting to be taken out. Patient transported back to [id number]

## 2017-11-06 NOTE — Progress Notes (Signed)
Patient notified this RN that he was having chest pain after being pulled up and repositioned in bed. MD notified. Ordered EKG. PRN pain med given.   Patient also experiencing abdominal pain. This pain has been consistent tonight. Tube feeds were implemented 1700 10/22.  Will continue to monitor.

## 2017-11-06 NOTE — Progress Notes (Deleted)
Pharmacy Antibiotic Note  Malik Brooks is a 62 y.o. male admitted on 11/04/2017 s/p cardiac arrest and found to have pneumonia.  Pharmacy has been consulted for vancomycin and cefepime dosing. Blood cultures from 10/21 now growing staph epidermidis in 3/5 bottles. Today patient is afebrile, WBC count has trended up from yesterday from 11.7 to 12.5, and Scr remains elevated at 5.06. Due to patient's past medical history of paraplegia and current elevated Scr, the utilization of vancomycin random levels are guiding therapy. Today patient's Vancomycin random level was supratherapeutic at 21.  Of note, patient's PICC line placed prior to admission on 10/21 planned to be removed today to eliminate sources of infection.  Plan: Hold vancomycin dosing for today Continue Cefepime 1G IV Q24H F/u SCr trend for further doses F/u renal fxn, C&S, clinical status F/u continuation of cefepime or other gram neg coverage  Height: 5\' 8"  (172.7 cm) Weight: 290 lb 5.5 oz (131.7 kg) IBW/kg (Calculated) : 68.4  Temp (24hrs), Avg:97.8 F (36.6 C), Min:97.6 F (36.4 C), Max:98.3 F (36.8 C)  Recent Labs  Lab 11/04/17 1330 11/04/17 1332 11/04/17 1349 11/05/17 0435 11/05/17 2001 11/06/17 0358 11/06/17 0450  WBC 20.8*  --   --  13.7* 11.7* 12.5*  --   CREATININE 4.62* 5.20*  --  4.31* 5.09* 5.06*  --   LATICACIDVEN  --   --  1.78  --  0.8  --  0.7  VANCOTROUGH  --   --   --   --  22*  --   --   VANCORANDOM  --   --   --   --   --   --  21    Estimated Creatinine Clearance: 19.5 mL/min (A) (by C-G formula based on SCr of 5.06 mg/dL (H)).    Not on File  Antimicrobials this admission: Vanc 10/21 >> Cefepime 10/21 >>  Dose adjustments this admission: N/A  Microbiology results: Bcx 10/21: BCID Staph spp. Methicillin resistant MRSA PCR 10/21: positive  Thank you for allowing pharmacy to be a part of this patient's care.  Lenord Carbo, PharmD PGY1 Pharmacy Resident Phone: [phone]  Please check AMION for all [facility] Pharmacy phone numbers 11/06/2017 12:10 PM

## 2017-11-07 ENCOUNTER — Inpatient Hospital Stay (HOSPITAL_COMMUNITY): Payer: Medicare Other

## 2017-11-07 DIAGNOSIS — I619 Nontraumatic intracerebral hemorrhage, unspecified: Secondary | ICD-10-CM | POA: Diagnosis present

## 2017-11-07 DIAGNOSIS — I611 Nontraumatic intracerebral hemorrhage in hemisphere, cortical: Secondary | ICD-10-CM

## 2017-11-07 LAB — CBC
HCT: 29 % — ABNORMAL LOW (ref 39.0–52.0)
HEMOGLOBIN: 9.1 g/dL — AB (ref 13.0–17.0)
MCH: 24.7 pg — ABNORMAL LOW (ref 26.0–34.0)
MCHC: 31.4 g/dL (ref 30.0–36.0)
MCV: 78.8 fL — ABNORMAL LOW (ref 80.0–100.0)
NRBC: 0 % (ref 0.0–0.2)
Platelets: 255 10*3/uL (ref 150–400)
RBC: 3.68 MIL/uL — ABNORMAL LOW (ref 4.22–5.81)
RDW: 19.4 % — AB (ref 11.5–15.5)
WBC: 16.4 10*3/uL — AB (ref 4.0–10.5)

## 2017-11-07 LAB — TROPONIN I: Troponin I: <0.03 ng/mL (ref <0.03)

## 2017-11-07 LAB — VANCOMYCIN, RANDOM: Vancomycin Rm: 16

## 2017-11-07 LAB — BASIC METABOLIC PANEL
Anion gap: 11 (ref 5–15)
BUN: 122 mg/dL — AB (ref 8–23)
CO2: 21 mmol/L — ABNORMAL LOW (ref 22–32)
Calcium: 7.6 mg/dL — ABNORMAL LOW (ref 8.9–10.3)
Chloride: 100 mmol/L (ref 98–111)
Creatinine, Ser: 4.83 mg/dL — ABNORMAL HIGH (ref 0.61–1.24)
GFR, EST AFRICAN AMERICAN: 13 mL/min — AB (ref >60)
GFR, EST NON AFRICAN AMERICAN: 12 mL/min — AB (ref >60)
Glucose, Bld: 143 mg/dL — ABNORMAL HIGH (ref 70–99)
POTASSIUM: 4.2 mmol/L (ref 3.5–5.1)
SODIUM: 132 mmol/L — AB (ref 135–145)

## 2017-11-07 LAB — CULTURE, BLOOD (ROUTINE X 2)
SPECIAL REQUESTS: ADEQUATE
Special Requests: ADEQUATE

## 2017-11-07 LAB — GLUCOSE, CAPILLARY
GLUCOSE-CAPILLARY: 116 mg/dL — AB (ref 70–99)
GLUCOSE-CAPILLARY: 113 mg/dL — AB (ref 70–99)
GLUCOSE-CAPILLARY: 123 mg/dL — AB (ref 70–99)
GLUCOSE-CAPILLARY: 100 mg/dL — AB (ref 70–99)

## 2017-11-07 MED ORDER — PROMETHAZINE HCL 25 MG/ML IJ SOLN
12.5000 mg | Freq: Four times a day (QID) | INTRAMUSCULAR | Status: DC | PRN
  Administered 2017-11-08: 12.5 mg via INTRAVENOUS
  Filled 2017-11-07: qty 1

## 2017-11-07 MED ORDER — HEPARIN SODIUM (PORCINE) 5000 UNIT/ML IJ SOLN
5000.0000 [IU] | Freq: Three times a day (TID) | INTRAMUSCULAR | Status: DC
  Administered 2017-11-07 – 2017-11-08 (×3): 5000 [IU] via SUBCUTANEOUS
  Filled 2017-11-07 (×2): qty 1

## 2017-11-07 MED ORDER — ONDANSETRON HCL 4 MG/2ML IJ SOLN
4.0000 mg | Freq: Two times a day (BID) | INTRAMUSCULAR | Status: DC | PRN
  Administered 2017-11-08 – 2017-11-12 (×2): 4 mg via INTRAVENOUS
  Filled 2017-11-07 (×3): qty 2

## 2017-11-07 MED ORDER — LORAZEPAM 2 MG/ML IJ SOLN
1.0000 mg | Freq: Once | INTRAMUSCULAR | Status: AC
  Administered 2017-11-07: 1 mg via INTRAVENOUS
  Filled 2017-11-07: qty 1

## 2017-11-07 MED ORDER — VANCOMYCIN HCL IN DEXTROSE 1-5 GM/200ML-% IV SOLN
1000.0000 mg | Freq: Once | INTRAVENOUS | Status: AC
  Administered 2017-11-07: 1000 mg via INTRAVENOUS
  Filled 2017-11-07: qty 200

## 2017-11-07 MED ORDER — FAMOTIDINE 40 MG/5ML PO SUSR
20.0000 mg | Freq: Every day | ORAL | Status: DC
  Administered 2017-11-07 – 2017-11-11 (×5): 20 mg
  Filled 2017-11-07 (×6): qty 2.5
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
## 2017-11-07 NOTE — Progress Notes (Signed)
Came to room for breathing tx, noted HR 120's, sat 88%.  Pt feels slightly warm to touch.  Suctioned for small/mod tan secretions.  Will notify RN.  FIO2 was increased to 50%

## 2017-11-07 NOTE — Progress Notes (Signed)
Attending:    Subjective: Presented with Cardiac arrest, no recurrent cardiac issues Anemia improved Anxiety with bronchoscopy yesterday Has received 4 U blood total Making urine  Objective: Vitals:   11/07/17 1030 11/07/17 1045 11/07/17 1114 11/07/17 1145  BP: (!) 108/47 (!) 106/48  (!) 112/53  Pulse: 95 98  83  Resp: 20 (!) 22  (!) 23  Temp:   98.4 F (36.9 C)   TempSrc:   Oral   SpO2: 94% 94%  94%  Weight:      Height:       Vent Mode: PCV FiO2 (%):  [40 %] 40 % Set Rate:  [20 bmp] 20 bmp PEEP:  [5 cmH20] 5 cmH20 Plateau Pressure:  [15 cmH20-20 cmH20] 17 cmH20  Intake/Output Summary (Last 24 hours) at 11/07/2017 1157 Last data filed at 11/07/2017 0900 Gross per 24 hour  Intake 4136.83 ml  Output 2120 ml  Net 2016.83 ml    General:  Resting comfortably in bed HENT: NCAT trach in place PULM: CTA B, normal effort CV: RRR, no mgr GI: BS+, soft, nontender MSK: normal bulk and tone Neuro: awake, alert, no distress, MAEW   CBC    Component Value Date/Time   WBC 16.4 (H) 11/07/2017 0117   RBC 3.68 (L) 11/07/2017 0117   HGB 9.1 (L) 11/07/2017 0117   HCT 29.0 (L) 11/07/2017 0117   PLT 255 11/07/2017 0117   MCV 78.8 (L) 11/07/2017 0117   MCH 24.7 (L) 11/07/2017 0117   MCHC 31.4 11/07/2017 0117   RDW 19.4 (H) 11/07/2017 0117   LYMPHSABS 0.7 11/04/2017 1330   MONOABS 0.4 11/04/2017 1330   EOSABS 0.1 11/04/2017 1330   BASOSABS 0.0 11/04/2017 1330    BMET    Component Value Date/Time   NA 132 (L) 11/07/2017 0117   K 4.2 11/07/2017 0117   CL 100 11/07/2017 0117   CO2 21 (L) 11/07/2017 0117   GLUCOSE 143 (H) 11/07/2017 0117   BUN 122 (H) 11/07/2017 0117   CREATININE 4.83 (H) 11/07/2017 0117   CALCIUM 7.6 (L) 11/07/2017 0117   GFRNONAA 12 (L) 11/07/2017 0117   GFRAA 13 (L) 11/07/2017 0117    Impression/Plan: Chronic respiratory failure with hypoxemia> continue full vent suport, VAP prevention, trach change Intracerebral hemorrhage> couldn't tolerate  MRI brain, will defer to neurology regarding whether or not this is important enough for heavy sedation, hold anticoagulants Scaphoid fracture> splint Staph epi bacteremia> continue vanc, line holiday, repeat cultures; REMOVE PICC VRE UTI> ocnitnue vanc HCAP> vanc/cefepime to continue until repeat cultures back  Code Status: limited code blue  Maintain in ICU  CC time 31 minutes  [place], MD [facility] Pager: [phone] Cell: [phone] After 3pm or if no response, call [phone]

## 2017-11-07 NOTE — Progress Notes (Signed)
Preliminary notes--Bilateral carotid duplex exam completed. 1-39% bilateral ICAs. Bilateral vertebral arteries are patent with antegrade flow. Hongying Kalim Kissel (RDMS RVT) Marilynne Halsted RDMS RVT 11/07/17 12:30 PM

## 2017-11-07 NOTE — Progress Notes (Signed)
Interim progress/Transfer of care:  Patient consult paged to [facility] for transfer of care as patient no longer requires CCM level care.  Spoke to [facility] Team 1 who has accepted patient and will assume care.  -Dr. Parke Simmers
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
## 2017-11-08 ENCOUNTER — Inpatient Hospital Stay (HOSPITAL_COMMUNITY): Payer: Medicare Other

## 2017-11-08 DIAGNOSIS — S24102S Unspecified injury at T2-T6 level of thoracic spinal cord, sequela: Secondary | ICD-10-CM

## 2017-11-08 DIAGNOSIS — Z66 Do not resuscitate: Secondary | ICD-10-CM

## 2017-11-08 DIAGNOSIS — T83511A Infection and inflammatory reaction due to indwelling urethral catheter, initial encounter: Secondary | ICD-10-CM

## 2017-11-08 DIAGNOSIS — G8220 Paraplegia, unspecified: Secondary | ICD-10-CM

## 2017-11-08 DIAGNOSIS — I2510 Atherosclerotic heart disease of native coronary artery without angina pectoris: Secondary | ICD-10-CM

## 2017-11-08 DIAGNOSIS — J9611 Chronic respiratory failure with hypoxia: Secondary | ICD-10-CM

## 2017-11-08 DIAGNOSIS — I611 Nontraumatic intracerebral hemorrhage in hemisphere, cortical: Secondary | ICD-10-CM

## 2017-11-08 DIAGNOSIS — F17200 Nicotine dependence, unspecified, uncomplicated: Secondary | ICD-10-CM

## 2017-11-08 DIAGNOSIS — Z8674 Personal history of sudden cardiac arrest: Secondary | ICD-10-CM

## 2017-11-08 DIAGNOSIS — I482 Chronic atrial fibrillation, unspecified: Secondary | ICD-10-CM

## 2017-11-08 DIAGNOSIS — N390 Urinary tract infection, site not specified: Secondary | ICD-10-CM

## 2017-11-08 DIAGNOSIS — T80219A Unspecified infection due to central venous catheter, initial encounter: Secondary | ICD-10-CM

## 2017-11-08 DIAGNOSIS — E119 Type 2 diabetes mellitus without complications: Secondary | ICD-10-CM

## 2017-11-08 DIAGNOSIS — B9562 Methicillin resistant Staphylococcus aureus infection as the cause of diseases classified elsewhere: Secondary | ICD-10-CM

## 2017-11-08 DIAGNOSIS — I619 Nontraumatic intracerebral hemorrhage, unspecified: Secondary | ICD-10-CM

## 2017-11-08 DIAGNOSIS — B952 Enterococcus as the cause of diseases classified elsewhere: Secondary | ICD-10-CM

## 2017-11-08 LAB — GLUCOSE, CAPILLARY
GLUCOSE-CAPILLARY: 108 mg/dL — AB (ref 70–99)
GLUCOSE-CAPILLARY: 111 mg/dL — AB (ref 70–99)
GLUCOSE-CAPILLARY: 114 mg/dL — AB (ref 70–99)
GLUCOSE-CAPILLARY: 128 mg/dL — AB (ref 70–99)
GLUCOSE-CAPILLARY: 84 mg/dL (ref 70–99)
GLUCOSE-CAPILLARY: 98 mg/dL (ref 70–99)
Glucose-Capillary: 109 mg/dL — ABNORMAL HIGH (ref 70–99)

## 2017-11-08 LAB — BASIC METABOLIC PANEL
ANION GAP: 12 (ref 5–15)
BUN: 130 mg/dL — ABNORMAL HIGH (ref 8–23)
CALCIUM: 7.6 mg/dL — AB (ref 8.9–10.3)
CO2: 19 mmol/L — AB (ref 22–32)
CREATININE: 4.31 mg/dL — AB (ref 0.61–1.24)
Chloride: 105 mmol/L (ref 98–111)
GFR calc Af Amer: 15 mL/min — ABNORMAL LOW (ref >60)
GFR, EST NON AFRICAN AMERICAN: 13 mL/min — AB (ref >60)
Glucose, Bld: 95 mg/dL (ref 70–99)
Potassium: 4 mmol/L (ref 3.5–5.1)
Sodium: 136 mmol/L (ref 135–145)

## 2017-11-08 LAB — CBC
HCT: 26.9 % — ABNORMAL LOW (ref 39.0–52.0)
HEMOGLOBIN: 8.3 g/dL — AB (ref 13.0–17.0)
MCH: 24.7 pg — ABNORMAL LOW (ref 26.0–34.0)
MCHC: 30.9 g/dL (ref 30.0–36.0)
MCV: 80.1 fL (ref 80.0–100.0)
Platelets: 209 10*3/uL (ref 150–400)
RBC: 3.36 MIL/uL — AB (ref 4.22–5.81)
RDW: 20.1 % — ABNORMAL HIGH (ref 11.5–15.5)
WBC: 14.2 10*3/uL — ABNORMAL HIGH (ref 4.0–10.5)
nRBC: 0 % (ref 0.0–0.2)

## 2017-11-08 LAB — LIPID PANEL
CHOL/HDL RATIO: 2.8 ratio
CHOLESTEROL: 91 mg/dL (ref 0–200)
HDL: 32 mg/dL — ABNORMAL LOW (ref >40)
LDL Cholesterol: 44 mg/dL (ref 0–99)
TRIGLYCERIDES: 76 mg/dL (ref <150)
VLDL: 15 mg/dL (ref 0–40)

## 2017-11-08 LAB — URINE CULTURE: Culture: >=100000 — AB

## 2017-11-08 LAB — HEMOGLOBIN A1C
Hgb A1c MFr Bld: 6.1 % — ABNORMAL HIGH (ref 4.8–5.6)
Mean Plasma Glucose: 128.37 mg/dL

## 2017-11-08 MED ORDER — FENTANYL CITRATE (PF) 100 MCG/2ML IJ SOLN
12.5000 ug | INTRAMUSCULAR | Status: DC | PRN
  Administered 2017-11-12 (×2): 25 ug via INTRAVENOUS
  Filled 2017-11-08 (×2): qty 2

## 2017-11-08 MED ORDER — SENNOSIDES-DOCUSATE SODIUM 8.6-50 MG PO TABS
1.0000 | ORAL_TABLET | Freq: Two times a day (BID) | ORAL | Status: DC | PRN
  Administered 2017-11-08 – 2017-11-11 (×3): 1
  Filled 2017-11-08 (×4): qty 1

## 2017-11-08 MED ORDER — ADULT MULTIVITAMIN LIQUID CH
15.0000 mL | Freq: Every day | ORAL | Status: DC
  Administered 2017-11-08 – 2017-11-12 (×5): 15 mL
  Filled 2017-11-08 (×5): qty 15

## 2017-11-08 MED ORDER — OXYCODONE HCL 5 MG PO TABS
5.0000 mg | ORAL_TABLET | ORAL | Status: DC | PRN
  Administered 2017-11-08 – 2017-11-09 (×5): 5 mg
  Filled 2017-11-08 (×6): qty 1

## 2017-11-08 MED ORDER — ENSURE ENLIVE PO LIQD
237.0000 mL | Freq: Two times a day (BID) | ORAL | Status: DC
  Administered 2017-11-08 – 2017-11-09 (×3): 237 mL via ORAL

## 2017-11-08 MED ORDER — ACETAMINOPHEN 160 MG/5ML PO SOLN
650.0000 mg | Freq: Four times a day (QID) | ORAL | Status: DC | PRN
  Administered 2017-11-09: 650 mg
  Filled 2017-11-08: qty 20.3

## 2017-11-08 NOTE — Progress Notes (Signed)
CSW and RNCM  actively following pt for further discharge needs at this time. CSW aware that pt is from [facility]. CSW will remain involved for any further placement needs.    Claude Manges Murl Zogg, MSW, LCSW-A Emergency Department Clinical Social Worker [phone]

## 2017-11-08 NOTE — Care Management (Addendum)
11/08/2017  Attending in agreement with [facility] discharge when appropriate.  CM discussed LTACH options with POA - POA chose [facility] and informed CM she will only entertain [facility] if [facility] can not offer a bed.  [facility] would like to revisit for appropriateness on Monday 10/28.  Attending has not yet deemed pt stable for discharge due to new onset of infection - ID consulted.  CM also made tentative referral to [facility]  CM requested LTACH consideration from attending.    11/06/17 CM discussed LTACH with attending.  Pt now having renal failure - referral not appropriate at this time  Pt is from [facility] - pt is chronic trach now with resp difficulties requiring continuous vent.  CM requested LTACH to review chart to determine if pt may be appropriate for [facility] discharge
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
## 2017-11-08 NOTE — Progress Notes (Addendum)
Nutrition Follow-up  DOCUMENTATION CODES:   Morbid obesity  INTERVENTION:    Continue soft diet  Add Ensure Enlive po BID, each supplement provides 350 kcal and 20 grams of protein  Add multivitamin 15 ml daily via tube  Monitor for ability to meet nutrition needs PO and reduce TF volume  NUTRITION DIAGNOSIS:   Inadequate oral intake related to acute illness as evidenced by (diet just advanced).  Ongoing  GOAL:   Provide needs based on ASPEN/SCCM guidelines  Met with TF  MONITOR:   PO intake, Supplement acceptance, Vent status, TF tolerance, I & O's, Skin  ASSESSMENT:   62 yo male with PMH of COPD, CAD, DM, HTN, A fib, paraplegia, trach, and PEG who was admitted on 10/21 from [facility] s/p cardiac arrest.   Patient remains on vent support via tracheostomy.  He is receiving TF via PEG: Vital High Protein at 55 ml/h with Pro-stat 30 ml QID to provide 1720 kcal, 176 gm protein, 1104 ml free water daily. Tolerating well.   Patient communicated with RD by head nods and mouthing words. He usually eats whatever he wants. He does not receive TF via his PEG. Diet has been advanced to soft for lunch today. He was started on clear liquids 10/23.   Code status has been changed to partial code.  Labs reviewed. CBG's: 109-111 Medications reviewed and include sliding scale Novolog.  Diet Order:   Diet Order            DIET SOFT Room service appropriate? Yes; Fluid consistency: Thin  Diet effective now              EDUCATION NEEDS:   No education needs have been identified at this time  Skin:  Skin Assessment: Skin Integrity Issues: Skin Integrity Issues:: Stage IV, Other (Comment) Stage IV: sacrum Other: venous stasis ulcer L & R LE  Last BM:  10/24  Height:   Ht Readings from Last 1 Encounters:  11/04/17 5' 8"  (1.727 m)    Weight:   Wt Readings from Last 1 Encounters:  11/08/17 131 kg    Ideal Body Weight:  70 kg  BMI:  Body mass index is 43.91  kg/m.  Estimated Nutritional Needs:   Kcal:  1415-1800  Protein:  175 gm  Fluid:  2 L    Molli Barrows, RD, LDN, [place] Pager [phone] After Hours Pager [phone]

## 2017-11-08 NOTE — Progress Notes (Signed)
NAME:  Malik Brooks, MRN:  [id number], DOB:  [birth year], LOS: 4 ADMISSION DATE:  11/04/2017, CONSULTATION DATE:  11/04/2017 REFERRING MD:  EDP Madilyn Hook, CHIEF COMPLAINT:  Cardiac arrest   Brief History   Patient is a 62 year old male [facility] resident with a chronic trach that had some respiratory difficulties in [facility] and suffered a cardiac arrest.  Patient was DNR but received CPR, amiodarone, 2 rounds of epi and a shock with total downtime of 15 minutes. Evidently patient has been going to the urologist for chronic hematuria and requiring frequent transfusion.  In [facility], it was felt that the arrest was a respiratory code and patient was resuscitated despite of being DNR.  Patient evidently is vent dependent in [facility] after a prolonged illness.  Also has history of COPD on inhaled steroids and bronchodilators and has had significant mucous plugging and has required frequent bronchoscopies in the past.  Past Medical History   Significant Hospital Events   10/21 cardiac arrest ? Cause respiratory vs cardiac  Consults: date of consult/date signed off & final recs:  10/22>> Wound Care Nurse 10/22>> Social Work 10/22>> Case Management 10/23>> neurology 10/25>> [facility] Assumed medical care PCCM remains as Trach management 2 times a week  Procedures (surgical and bedside):  CPR 10/21  Significant Diagnostic Tests:  Head CT 10/21>>> 2.7 cc LEFT frontal intraparenchymal hematoma. No significant mass effect. No CT findings of hypoxic ischemic encephalopathy though if suspicion persists recommend follow up CT HEAD in 24-48 hours.   Brain MRI wo contrast> patient did not tolerate due to anxiety  Micro Data:  Blood 10/21>>> MRSA (contaminate suspected) Urine 10/21>>> enteroccocus sens pending .Marland Kitchen Sputum 10/22>>> (nasopharyngeal MRSA)  Antimicrobials:  Cefepime 10/21>>> Vancoycin 10/21>>>   Subjective:  Awake and following commands, MAE x 4, On full vent support  He continues to  express how miserable he is with his trach Per discussion with attending 10/23, code status now partial as he does not want compressions States no issues with trach  Objective   Blood pressure (!) 103/59, pulse 77, temperature 97.6 F (36.4 C), temperature source Oral, resp. rate 20, height 5\' 8"  (1.727 m), weight 131 kg, SpO2 97 %.    Vent Mode: PCV FiO2 (%):  [40 %-50 %] 40 % Set Rate:  [20 bmp] 20 bmp PEEP:  [5 cmH20] 5 cmH20 Plateau Pressure:  [15 cmH20-18 cmH20] 17 cmH20   Intake/Output Summary (Last 24 hours) at 11/08/2017 0937 Last data filed at 11/08/2017 0600 Gross per 24 hour  Intake 3071.63 ml  Output 2185 ml  Net 886.63 ml   Filed Weights   11/06/17 0500 11/07/17 0500 11/08/17 0730  Weight: 131.7 kg 133 kg 131 kg    Examination: largely unchanged from prior day General: Chronically ill appearing morbidly obese male, awake and alert on vent support off pressors and off all sedation  HENT: [place]/AT, PERRLA, Trach is secure and intact. 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  Patient was DNR and suffered a cardiac arrest and was resuscitated and sent to [facility].    Chronic respiratory failure:  CXR with left opacity and ? Right effusion Chronic vent at [facility] Chronic trach (  Portex 8 mm cuffed trach) Plan - Maintain on full vent support   - ABG prn - Consider  trach change - trach at bedside  - Trach care Q 12 and prn - Continue Duonebs - Titrate O2 for sat of 88-92% - Bronch done 10/23 - Trend CXR (10/22 w/ stable left opacity and right likely effusion)   Disposition / Summary of Today's Plan 11/08/17   Continue vent support and trach care as above PCCM will see 2 times weekly for Trach care and management All other care per [facility]>> Primary Team   Labs   CBC: Recent Labs  Lab 11/04/17 1330  11/05/17 0435 11/05/17 2001 11/06/17 0358 11/07/17 0117 11/08/17 0328  WBC 20.8*  --  13.7* 11.7* 12.5* 16.4* 14.2*  NEUTROABS 19.0*  --   --   --   --   --   --   HGB 6.2*   < > 6.6* 8.5* 8.8* 9.1* 8.3*  HCT 20.5*   < > 20.2* 26.6* 27.9* 29.0* 26.9*  MCV 78.2*  --  75.1* 78.5* 78.8* 78.8* 80.1  PLT 288  --  232 233 226 255 209   < > = values in this interval not displayed.    Basic Metabolic Panel: Recent Labs  Lab 11/04/17 1730 11/05/17 0435 11/05/17 2001 11/06/17 0358 11/07/17 0117 11/08/17 0328  NA  --  130* 131* 132* 132* 136  K  --  3.8 4.9 4.6 4.2 4.0  CL  --  100 96* 98 100 105  CO2  --  16* 21* 19* 21* 19*  GLUCOSE  --  121* 112* 111* 143* 95  BUN  --  109* 124* 124* 122* 130*  CREATININE  --  4.31* 5.09* 5.06* 4.83* 4.31*  CALCIUM  --  6.3* 7.4* 7.6* 7.6* 7.6*  MG 2.2 1.9 2.5* 2.5*  --   --   PHOS 6.5* 5.4*  --   --   --   --    GFR: Estimated Creatinine Clearance: 22.9 mL/min (A) (by C-G formula based on SCr of 4.31 mg/dL (H)). Recent Labs  Lab 11/04/17 1349 11/04/17 1730  11/05/17 2001 11/06/17 0358 11/06/17 0450 11/07/17 0117 11/08/17 0328  PROCALCITON  --  1.41  --   --   --   --   --   --   WBC  --   --    < > 11.7* 12.5*  --  16.4* 14.2*  LATICACIDVEN 1.78  --   --  0.8  --  0.7  --   --    < > = values in this interval not displayed.    Liver Function Tests: Recent Labs  Lab 11/04/17 1330  AST 11*  ALT 5   ALKPHOS 70  BILITOT 0.6  PROT 4.6*  ALBUMIN 1.3*   No results for input(s): LIPASE, AMYLASE in the last 168 hours. No results for input(s): AMMONIA in the last 168 hours.  ABG    Component Value Date/Time   PHART 7.306 (L) 11/06/2017 0446   PCO2ART 43.5 11/06/2017 0446   PO2ART 76.0 (L) 11/06/2017 0446   HCO3  21.8 11/06/2017 0446   TCO2 23 11/06/2017 0446   ACIDBASEDEF 4.0 (H) 11/06/2017 0446   O2SAT 94.0 11/06/2017 0446     Coagulation Profile: Recent Labs  Lab 11/04/17 1330  INR 1.96    Cardiac Enzymes: Recent Labs  Lab 11/05/17 2001 11/06/17 0358 11/06/17 1122 11/06/17 1841 11/07/17 0117  TROPONINI 0.08* 0.05* 0.05* 0.05* <0.03    HbA1C: Hgb A1c MFr Bld  Date/Time Value Ref Range Status  11/08/2017 03:28 AM 6.1 (H) 4.8 - 5.6 % Final    Comment:    (NOTE) Pre diabetes:          5.7%-6.4% Diabetes:              >6.4% Glycemic control for   <7.0% adults with diabetes     CBG: Recent Labs  Lab 11/07/17 1112 11/07/17 1523 11/07/17 1917 11/07/17 2358 11/08/17 0410  GLUCAP 123* 100* 98 128* 84    Admitting History of Present Illness.     Past Medical History  He,  has a past medical history of Acute on chronic respiratory failure with hypoxia (HCC) (06/07/2017), Atelectasis, left (06/07/2017), Chronic atrial fibrillation, COPD with chronic bronchitis (HCC) (06/07/2017), Coronary artery disease, Diabetes mellitus (HCC), DVT (deep venous thrombosis) (HCC), Hypertension, and Paraplegia (HCC).   Surgical History    Past Surgical History:  Procedure Laterality Date  . PEG PLACEMENT    . TRACHEOSTOMY       Social History   Social History   Socioeconomic History  . Marital status: Married    Spouse name: Not on file  . Number of children: Not on file  . Years of education: Not on file  . Highest education level: Not on file  Occupational History  . Not on file  Social Needs  . Financial resource strain: Not on file  . Food insecurity:     Worry: Not on file    Inability: Not on file  . Transportation needs:    Medical: Not on file    Non-medical: Not on file  Tobacco Use  . Smoking status: Current Every Day Smoker  . Smokeless tobacco: Never Used  Substance and Sexual Activity  . Alcohol use: Not Currently  . Drug use: Not on file  . Sexual activity: Not Currently  Lifestyle  . Physical activity:    Days per week: Not on file    Minutes per session: Not on file  . Stress: Not on file  Relationships  . Social connections:    Talks on phone: Not on file    Gets together: Not on file    Attends religious service: Not on file    Active member of club or organization: Not on file    Attends meetings of clubs or organizations: Not on file    Relationship status: Not on file  . Intimate partner violence:    Fear of current or ex partner: Not on file    Emotionally abused: Not on file    Physically abused: Not on file    Forced sexual activity: Not on file  Other Topics Concern  . Not on file  Social History Narrative  . Not on file  ,  reports that he has been smoking. He has never used smokeless tobacco. He reports that he drank alcohol.   Family History   His Family history is unknown by patient.   Allergies Not on File   Home Medications  Prior to Admission medications   Not on File  Critical care time:      Bevelyn Ngo, [facility] Pulmonary/Critical Care Medicine Pager # [phone] 11/08/2017 9:37 AM
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
## 2017-11-09 ENCOUNTER — Inpatient Hospital Stay (HOSPITAL_COMMUNITY): Payer: Medicare Other

## 2017-11-09 DIAGNOSIS — R7881 Bacteremia: Secondary | ICD-10-CM | POA: Diagnosis present

## 2017-11-09 DIAGNOSIS — Z9911 Dependence on respirator [ventilator] status: Secondary | ICD-10-CM

## 2017-11-09 DIAGNOSIS — B957 Other staphylococcus as the cause of diseases classified elsewhere: Secondary | ICD-10-CM | POA: Diagnosis present

## 2017-11-09 DIAGNOSIS — T80219S Unspecified infection due to central venous catheter, sequela: Secondary | ICD-10-CM

## 2017-11-09 DIAGNOSIS — I9589 Other hypotension: Secondary | ICD-10-CM

## 2017-11-09 LAB — CBC
HEMATOCRIT: 27.8 % — AB (ref 39.0–52.0)
HEMOGLOBIN: 8.3 g/dL — AB (ref 13.0–17.0)
MCH: 23.9 pg — ABNORMAL LOW (ref 26.0–34.0)
MCHC: 29.9 g/dL — ABNORMAL LOW (ref 30.0–36.0)
MCV: 80.1 fL (ref 80.0–100.0)
NRBC: 0 % (ref 0.0–0.2)
PLATELETS: 210 10*3/uL (ref 150–400)
RBC: 3.47 MIL/uL — ABNORMAL LOW (ref 4.22–5.81)
RDW: 20.3 % — AB (ref 11.5–15.5)
WBC: 15 10*3/uL — AB (ref 4.0–10.5)

## 2017-11-09 LAB — GLUCOSE, CAPILLARY
GLUCOSE-CAPILLARY: 101 mg/dL — AB (ref 70–99)
GLUCOSE-CAPILLARY: 106 mg/dL — AB (ref 70–99)
GLUCOSE-CAPILLARY: 125 mg/dL — AB (ref 70–99)
GLUCOSE-CAPILLARY: 95 mg/dL (ref 70–99)
Glucose-Capillary: 138 mg/dL — ABNORMAL HIGH (ref 70–99)
Glucose-Capillary: 124 mg/dL — ABNORMAL HIGH (ref 70–99)

## 2017-11-09 LAB — COMPREHENSIVE METABOLIC PANEL
ALBUMIN: 1.7 g/dL — AB (ref 3.5–5.0)
ALT: 8 U/L (ref 0–44)
ANION GAP: 13 (ref 5–15)
AST: 10 U/L — ABNORMAL LOW (ref 15–41)
Alkaline Phosphatase: 58 U/L (ref 38–126)
BILIRUBIN TOTAL: 0.7 mg/dL (ref 0.3–1.2)
BUN: 138 mg/dL — ABNORMAL HIGH (ref 8–23)
CO2: 20 mmol/L — ABNORMAL LOW (ref 22–32)
Calcium: 7.8 mg/dL — ABNORMAL LOW (ref 8.9–10.3)
Chloride: 105 mmol/L (ref 98–111)
Creatinine, Ser: 4.13 mg/dL — ABNORMAL HIGH (ref 0.61–1.24)
GFR calc Af Amer: 16 mL/min — ABNORMAL LOW (ref >60)
GFR calc non Af Amer: 14 mL/min — ABNORMAL LOW (ref >60)
GLUCOSE: 141 mg/dL — AB (ref 70–99)
POTASSIUM: 3.6 mmol/L (ref 3.5–5.1)
Sodium: 138 mmol/L (ref 135–145)
Total Protein: 5.2 g/dL — ABNORMAL LOW (ref 6.5–8.1)

## 2017-11-09 LAB — CULTURE, BLOOD (ROUTINE X 2)
Culture: NO GROWTH
Special Requests: ADEQUATE

## 2017-11-09 LAB — VANCOMYCIN, RANDOM: Vancomycin Rm: 22

## 2017-11-09 MED ORDER — VANCOMYCIN HCL IN DEXTROSE 1-5 GM/200ML-% IV SOLN
1000.0000 mg | Freq: Once | INTRAVENOUS | Status: AC
  Administered 2017-11-09: 1000 mg via INTRAVENOUS
  Filled 2017-11-09: qty 200

## 2017-11-09 MED ORDER — ALBUTEROL SULFATE (2.5 MG/3ML) 0.083% IN NEBU
2.5000 mg | INHALATION_SOLUTION | Freq: Once | RESPIRATORY_TRACT | Status: AC
  Administered 2017-11-09: 2.5 mg via RESPIRATORY_TRACT

## 2017-11-09 MED ORDER — ACETYLCYSTEINE 20 % IN SOLN
3.0000 mL | Freq: Three times a day (TID) | RESPIRATORY_TRACT | Status: AC
  Administered 2017-11-09 – 2017-11-11 (×9): 3 mL via RESPIRATORY_TRACT
  Filled 2017-11-09 (×9): qty 4

## 2017-11-09 MED ORDER — ALBUTEROL SULFATE (2.5 MG/3ML) 0.083% IN NEBU
INHALATION_SOLUTION | RESPIRATORY_TRACT | Status: AC
  Administered 2017-11-09: 2.5 mg via RESPIRATORY_TRACT
  Filled 2017-11-09: qty 3

## 2017-11-09 NOTE — Progress Notes (Signed)
Pharmacy Antibiotic Note  Malik Brooks is a 62 y.o. male admitted on 11/04/2017 s/p cardiac arrest and found to have pneumonia.  Pharmacy has been consulted for vancomycin and cefepime dosing.  Today patient is afebrile, WBC count has trended down slightly to 15, and Scr has continued to trend down to 4.1. Due to patient's past medical history of paraplegia and current elevated Scr, will continue to utilize vancomycin random levels in guiding therapy.   Today patient's Vancomycin random level was above goal at 22 mcg/mL. Patient was given 1g of vancomycin on 10/24 at 1600. Patient appears to be appropriate for 1g q48 hours for now, but will hold off on scheduling repeated doses due to creatinine trends. Will give another 1x dose today and follow course through weekend with expectation of another random level or dose 10/28.    Plan: Give vancomycin 1000mg  IV x 1  Continue Cefepime 1G IV Q24H  F/u SCr trend for further doses F/u renal fxn, C&S, clinical status  Height: 5\' 8"  (172.7 cm) Weight: 292 lb 15.9 oz (132.9 kg) IBW/kg (Calculated) : 68.4  Temp (24hrs), Avg:97.9 F (36.6 C), Min:96.8 F (36 C), Max:99.5 F (37.5 C)  Recent Labs  Lab 11/04/17 1349  11/05/17 2001 11/06/17 0358  11/06/17 0450 11/07/17 0117 11/07/17 1135 11/08/17 0328 11/09/17 0515  WBC  --    < > 11.7* 12.5*  --   --  16.4*  --  14.2* 15.0*  CREATININE  --    < > 5.09* 5.06*  --   --  4.83*  --  4.31* 4.13*  LATICACIDVEN 1.78  --  0.8  --   --  0.7  --   --   --   --   VANCOTROUGH  --   --  22*  --   --   --   --   --   --   --   VANCORANDOM  --   --   --   --    < > 21  --  16  --  22   < > = values in this interval not displayed.    Estimated Creatinine Clearance: 24.1 mL/min (A) (by C-G formula based on SCr of 4.13 mg/dL (H)).    Not on File  Antimicrobials this admission: Vancomycin 10/21 >> Cefepime 10/21 >>  Dose adjustments this admission: 10/26: VR 22>>1g x1   Microbiology  results: 10/21 Bld Cx x2: Staph Epidermidis (S to Vancomycin) 10/21 MRSA PCR: positive 10/21 UCx : E. Faecalis (S to Vancomycin) 10/25 Bld Cx x2: sent   Thank you for allowing pharmacy to be a part of this patient's care.  Sheppard Coil PharmD., BCPS Clinical Pharmacist 11/09/2017 7:45 AM   Please check AMION for all [facility] phone numbers 11/09/2017 7:39 AM
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
## 2017-11-10 ENCOUNTER — Inpatient Hospital Stay (HOSPITAL_COMMUNITY): Payer: Medicare Other

## 2017-11-10 DIAGNOSIS — J9811 Atelectasis: Secondary | ICD-10-CM

## 2017-11-10 DIAGNOSIS — R7881 Bacteremia: Secondary | ICD-10-CM

## 2017-11-10 DIAGNOSIS — Z9911 Dependence on respirator [ventilator] status: Secondary | ICD-10-CM

## 2017-11-10 LAB — CBC
HCT: 25.9 % — ABNORMAL LOW (ref 39.0–52.0)
Hemoglobin: 8.2 g/dL — ABNORMAL LOW (ref 13.0–17.0)
MCH: 25.1 pg — ABNORMAL LOW (ref 26.0–34.0)
MCHC: 31.7 g/dL (ref 30.0–36.0)
MCV: 79.2 fL — ABNORMAL LOW (ref 80.0–100.0)
NRBC: 0 % (ref 0.0–0.2)
PLATELETS: 198 10*3/uL (ref 150–400)
RBC: 3.27 MIL/uL — AB (ref 4.22–5.81)
RDW: 20.6 % — ABNORMAL HIGH (ref 11.5–15.5)
WBC: 17.1 10*3/uL — ABNORMAL HIGH (ref 4.0–10.5)

## 2017-11-10 LAB — GLUCOSE, CAPILLARY
GLUCOSE-CAPILLARY: 115 mg/dL — AB (ref 70–99)
Glucose-Capillary: 111 mg/dL — ABNORMAL HIGH (ref 70–99)
Glucose-Capillary: 124 mg/dL — ABNORMAL HIGH (ref 70–99)
Glucose-Capillary: 141 mg/dL — ABNORMAL HIGH (ref 70–99)
Glucose-Capillary: 99 mg/dL (ref 70–99)
Glucose-Capillary: 99 mg/dL (ref 70–99)

## 2017-11-10 LAB — COMPREHENSIVE METABOLIC PANEL
ALT: 8 U/L (ref 0–44)
ANION GAP: 14 (ref 5–15)
AST: 14 U/L — ABNORMAL LOW (ref 15–41)
Albumin: 1.7 g/dL — ABNORMAL LOW (ref 3.5–5.0)
Alkaline Phosphatase: 54 U/L (ref 38–126)
BUN: 144 mg/dL — AB (ref 8–23)
CO2: 20 mmol/L — ABNORMAL LOW (ref 22–32)
Calcium: 8 mg/dL — ABNORMAL LOW (ref 8.9–10.3)
Chloride: 107 mmol/L (ref 98–111)
Creatinine, Ser: 3.74 mg/dL — ABNORMAL HIGH (ref 0.61–1.24)
GFR, EST AFRICAN AMERICAN: 18 mL/min — AB (ref >60)
GFR, EST NON AFRICAN AMERICAN: 16 mL/min — AB (ref >60)
Glucose, Bld: 130 mg/dL — ABNORMAL HIGH (ref 70–99)
POTASSIUM: 3.3 mmol/L — AB (ref 3.5–5.1)
Sodium: 141 mmol/L (ref 135–145)
TOTAL PROTEIN: 5.6 g/dL — AB (ref 6.5–8.1)
Total Bilirubin: 0.6 mg/dL (ref 0.3–1.2)

## 2017-11-10 LAB — PHOSPHORUS: PHOSPHORUS: 6.3 mg/dL — AB (ref 2.5–4.6)

## 2017-11-10 MED ORDER — LANTHANUM CARBONATE 500 MG PO CHEW
250.0000 mg | CHEWABLE_TABLET | Freq: Three times a day (TID) | ORAL | Status: DC
  Administered 2017-11-10 – 2017-11-11 (×3): 250 mg
  Filled 2017-11-10 (×4): qty 1

## 2017-11-10 MED ORDER — ENSURE ENLIVE PO LIQD: 237.0000 mL | Freq: Two times a day (BID) | ORAL | Status: DC | PRN

## 2017-11-10 MED ORDER — GUAIFENESIN 100 MG/5ML PO SOLN
10.0000 mL | Freq: Four times a day (QID) | ORAL | Status: DC
  Administered 2017-11-10 – 2017-11-12 (×9): 200 mg
  Filled 2017-11-10 (×12): qty 10

## 2017-11-10 MED ORDER — SODIUM CHLORIDE 3 % IN NEBU
4.0000 mL | INHALATION_SOLUTION | Freq: Two times a day (BID) | RESPIRATORY_TRACT | Status: DC
  Administered 2017-11-10 – 2017-11-12 (×5): 4 mL via RESPIRATORY_TRACT
  Filled 2017-11-10 (×6): qty 4
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
## 2017-11-10 NOTE — Progress Notes (Signed)
NAME:  Malik Brooks, MRN:  [id number], DOB:  [birth year], LOS: 6 ADMISSION DATE:  11/04/2017, CONSULTATION DATE:  11/04/2017 REFERRING MD:  EDP Madilyn Hook, CHIEF COMPLAINT:  Cardiac arrest   Brief History   Patient is a 62 year old male [facility] resident with a chronic trach that had some respiratory difficulties in [facility] and suffered a cardiac arrest.  Patient was DNR but received CPR, amiodarone, 2 rounds of epi and a shock with total downtime of 15 minutes. Evidently patient has been going to the urologist for chronic hematuria and requiring frequent transfusion.  In [facility], it was felt that the arrest was a respiratory code and patient was resuscitated despite of being DNR.  Patient evidently is vent dependent in [facility] after a prolonged illness.  Also has history of COPD on inhaled steroids and bronchodilators and has had significant mucous plugging and has required frequent bronchoscopies in the past.  Past Medical History   Significant Hospital Events   10/21 cardiac arrest ? Cause respiratory vs cardiac  Consults: date of consult/date signed off & final recs:  10/22>> Wound Care Nurse 10/22>> Social Work 10/22>> Case Management 10/23>> neurology 10/25>> [facility] Assumed medical care PCCM remains as Trach management 2 times a week  Procedures (surgical and bedside):  CPR 10/21  Significant Diagnostic Tests:  Head CT 10/21>>> 2.7 cc LEFT frontal intraparenchymal hematoma. No significant mass effect. No CT findings of hypoxic ischemic encephalopathy though if suspicion persists recommend follow up CT HEAD in 24-48 hours.   Brain MRI wo contrast> patient did not tolerate due to anxiety  Micro Data:  Blood 10/21>>> MRSA (contaminate suspected) Urine 10/21>>> enteroccocus sens pending .Marland Kitchen Sputum 10/22>>> (nasopharyngeal MRSA)  Antimicrobials:  Cefepime 10/21>>> Vancoycin 10/21>>>   Subjective:  Mucus plugging left lung over weekend Started chest PT Remains mechanically  ventilated without significant change to oxygenation  Objective   Blood pressure (!) 132/57, pulse 65, temperature 98 F (36.7 C), temperature source Axillary, resp. rate 17, height 5\' 8"  (1.727 m), weight 131.4 kg, SpO2 97 %.    Vent Mode: PCV FiO2 (%):  [40 %] 40 % Set Rate:  [20 bmp] 20 bmp PEEP:  [5 cmH20] 5 cmH20 Pressure Support:  [5 cmH20] 5 cmH20 Plateau Pressure:  [17 cmH20-21 cmH20] 17 cmH20   Intake/Output Summary (Last 24 hours) at 11/10/2017 1113 Last data filed at 11/10/2017 1000 Gross per 24 hour  Intake 2025.22 ml  Output 2560 ml  Net -534.78 ml   Filed Weights   11/08/17 0730 11/09/17 0500 11/10/17 0500  Weight: 131 kg 132.9 kg 131.4 kg    Examination: largely unchanged from prior day  General:  In bed on vent HENT: NCAT tracheostomy in place PULM: Diminished left base but clear on right, vent supported breathing CV: RRR, no mgr GI: BS+, soft, nontender MSK: normal bulk and tone Neuro: asleep on my exam    Resolved Hospital Problem list   N/A  Assessment & Plan:  62 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  Patient was DNR and suffered a cardiac arrest and was resuscitated and sent to [facility].    Chronic respiratory failure:  Mucus plug left lung> new problem, suspect that his original cardiac arrest was due to something similar Chronic vent at [facility] Chronic trach ( Portex 8 mm cuffed trach) Plan - don't wean vent - start hypertonic saline neb bid - start guaifenesin  - continue  mucomyst - continue chest PT - repeat CXR in AM, may need bronch - maintain full vent support    Disposition / Summary of Today's Plan 11/10/17   Read above   Labs   CBC: Recent Labs  Lab 11/04/17 1330  11/06/17 0358 11/07/17 0117 11/08/17 0328 11/09/17 0515 11/10/17 0400  WBC 20.8*   < > 12.5* 16.4* 14.2* 15.0* 17.1*  NEUTROABS 19.0*  --   --   --   --   --   --   HGB 6.2*    < > 8.8* 9.1* 8.3* 8.3* 8.2*  HCT 20.5*   < > 27.9* 29.0* 26.9* 27.8* 25.9*  MCV 78.2*   < > 78.8* 78.8* 80.1 80.1 79.2*  PLT 288   < > 226 255 209 210 198   < > = values in this interval not displayed.    Basic Metabolic Panel: Recent Labs  Lab 11/04/17 1730 11/05/17 0435 11/05/17 2001 11/06/17 0358 11/07/17 0117 11/08/17 0328 11/09/17 0515 11/10/17 0400  NA  --  130* 131* 132* 132* 136 138 141  K  --  3.8 4.9 4.6 4.2 4.0 3.6 3.3*  CL  --  100 96* 98 100 105 105 107  CO2  --  16* 21* 19* 21* 19* 20* 20*  GLUCOSE  --  121* 112* 111* 143* 95 141* 130*  BUN  --  109* 124* 124* 122* 130* 138* 144*  CREATININE  --  4.31* 5.09* 5.06* 4.83* 4.31* 4.13* 3.74*  CALCIUM  --  6.3* 7.4* 7.6* 7.6* 7.6* 7.8* 8.0*  MG 2.2 1.9 2.5* 2.5*  --   --   --   --   PHOS 6.5* 5.4*  --   --   --   --   --  6.3*   GFR: Estimated Creatinine Clearance: 26.4 mL/min (A) (by C-G formula based on SCr of 3.74 mg/dL (H)). Recent Labs  Lab 11/04/17 1349 11/04/17 1730  11/05/17 2001  11/06/17 0450 11/07/17 0117 11/08/17 0328 11/09/17 0515 11/10/17 0400  PROCALCITON  --  1.41  --   --   --   --   --   --   --   --   WBC  --   --    < > 11.7*   < >  --  16.4* 14.2* 15.0* 17.1*  LATICACIDVEN 1.78  --   --  0.8  --  0.7  --   --   --   --    < > = values in this interval not displayed.    Liver Function Tests: Recent Labs  Lab 11/04/17 1330 11/09/17 0515 11/10/17 0400  AST 11* 10* 14*  ALT 5 8 8   ALKPHOS 70 58 54  BILITOT 0.6 0.7 0.6  PROT 4.6* 5.2* 5.6*  ALBUMIN 1.3* 1.7* 1.7*   No results for input(s): LIPASE, AMYLASE in the last 168 hours. No results for input(s): AMMONIA in the last 168 hours.  ABG    Component Value Date/Time   PHART 7.306 (L) 11/06/2017 0446   PCO2ART 43.5 11/06/2017 0446   PO2ART 76.0 (L) 11/06/2017 0446   HCO3 21.8 11/06/2017 0446   TCO2 23 11/06/2017 0446   ACIDBASEDEF 4.0 (H) 11/06/2017 0446   O2SAT 94.0 11/06/2017 0446     Coagulation Profile: Recent  Labs  Lab 11/04/17 1330  INR 1.96    Cardiac Enzymes: Recent Labs  Lab 11/05/17 2001 11/06/17 0358 11/06/17 1122 11/06/17 1841 11/07/17 0117  TROPONINI 0.08*  0.05* 0.05* 0.05* <0.03    HbA1C: Hgb A1c MFr Bld  Date/Time Value Ref Range Status  11/08/2017 03:28 AM 6.1 (H) 4.8 - 5.6 % Final    Comment:    (NOTE) Pre diabetes:          5.7%-6.4% Diabetes:              >6.4% Glycemic control for   <7.0% adults with diabetes     CBG: Recent Labs  Lab 11/09/17 1518 11/09/17 1936 11/10/17 0013 11/10/17 0349 11/10/17 0723  GLUCAP 106* 125* 99 111* 99    Admitting History of Present Illness.     Past Medical History  He,  has a past medical history of Acute on chronic respiratory failure with hypoxia (HCC) (06/07/2017), Atelectasis, left (06/07/2017), Chronic atrial fibrillation, COPD with chronic bronchitis (HCC) (06/07/2017), Coronary artery disease, Diabetes mellitus (HCC), DVT (deep venous thrombosis) (HCC), Hypertension, and Paraplegia (HCC).   Surgical History    Past Surgical History:  Procedure Laterality Date  . PEG PLACEMENT    . TRACHEOSTOMY       Social History   Social History   Socioeconomic History  . Marital status: Married    Spouse name: Not on file  . Number of children: Not on file  . Years of education: Not on file  . Highest education level: Not on file  Occupational History  . Not on file  Social Needs  . Financial resource strain: Not on file  . Food insecurity:    Worry: Not on file    Inability: Not on file  . Transportation needs:    Medical: Not on file    Non-medical: Not on file  Tobacco Use  . Smoking status: Current Every Day Smoker  . Smokeless tobacco: Never Used  Substance and Sexual Activity  . Alcohol use: Not Currently  . Drug use: Not on file  . Sexual activity: Not Currently  Lifestyle  . Physical activity:    Days per week: Not on file    Minutes per session: Not on file  . Stress: Not on file    Relationships  . Social connections:    Talks on phone: Not on file    Gets together: Not on file    Attends religious service: Not on file    Active member of club or organization: Not on file    Attends meetings of clubs or organizations: Not on file    Relationship status: Not on file  . Intimate partner violence:    Fear of current or ex partner: Not on file    Emotionally abused: Not on file    Physically abused: Not on file    Forced sexual activity: Not on file  Other Topics Concern  . Not on file  Social History Narrative  . Not on file  ,  reports that he has been smoking. He has never used smokeless tobacco. He reports that he drank alcohol.   Family History   His Family history is unknown by patient.   Allergies Not on File   Home Medications  Prior to Admission medications   Not on File    Heber Womelsdorf, MD [facility] Pager: [phone] Cell: [phone] After 3pm or if no response, call [phone]    11/10/2017 11:13 AM

## 2017-11-11 ENCOUNTER — Inpatient Hospital Stay (HOSPITAL_COMMUNITY): Payer: Medicare Other

## 2017-11-11 DIAGNOSIS — J9611 Chronic respiratory failure with hypoxia: Secondary | ICD-10-CM

## 2017-11-11 LAB — GLUCOSE, CAPILLARY
GLUCOSE-CAPILLARY: 110 mg/dL — AB (ref 70–99)
GLUCOSE-CAPILLARY: 116 mg/dL — AB (ref 70–99)
GLUCOSE-CAPILLARY: 138 mg/dL — AB (ref 70–99)
Glucose-Capillary: 122 mg/dL — ABNORMAL HIGH (ref 70–99)
Glucose-Capillary: 144 mg/dL — ABNORMAL HIGH (ref 70–99)
Glucose-Capillary: 117 mg/dL — ABNORMAL HIGH (ref 70–99)

## 2017-11-11 LAB — CBC
HCT: 25.2 % — ABNORMAL LOW (ref 39.0–52.0)
Hemoglobin: 7.9 g/dL — ABNORMAL LOW (ref 13.0–17.0)
MCH: 24.8 pg — ABNORMAL LOW (ref 26.0–34.0)
MCHC: 31.3 g/dL (ref 30.0–36.0)
MCV: 79.2 fL — AB (ref 80.0–100.0)
PLATELETS: 189 10*3/uL (ref 150–400)
RBC: 3.18 MIL/uL — AB (ref 4.22–5.81)
RDW: 20.6 % — AB (ref 11.5–15.5)
WBC: 16.1 10*3/uL — ABNORMAL HIGH (ref 4.0–10.5)
nRBC: 0 % (ref 0.0–0.2)

## 2017-11-11 LAB — RENAL FUNCTION PANEL
Albumin: 1.9 g/dL — ABNORMAL LOW (ref 3.5–5.0)
Anion gap: 11 (ref 5–15)
BUN: 149 mg/dL — ABNORMAL HIGH (ref 8–23)
CHLORIDE: 109 mmol/L (ref 98–111)
CO2: 22 mmol/L (ref 22–32)
CREATININE: 3.41 mg/dL — AB (ref 0.61–1.24)
Calcium: 8.3 mg/dL — ABNORMAL LOW (ref 8.9–10.3)
GFR calc non Af Amer: 18 mL/min — ABNORMAL LOW (ref >60)
GFR, EST AFRICAN AMERICAN: 20 mL/min — AB (ref >60)
Glucose, Bld: 127 mg/dL — ABNORMAL HIGH (ref 70–99)
Phosphorus: 6.1 mg/dL — ABNORMAL HIGH (ref 2.5–4.6)
Potassium: 4 mmol/L (ref 3.5–5.1)
Sodium: 142 mmol/L (ref 135–145)

## 2017-11-11 LAB — VANCOMYCIN, RANDOM: Vancomycin Rm: 27

## 2017-11-11 MED ORDER — LANTHANUM CARBONATE 500 MG PO CHEW
500.0000 mg | CHEWABLE_TABLET | Freq: Three times a day (TID) | ORAL | Status: DC
  Administered 2017-11-11 – 2017-11-12 (×4): 500 mg
  Filled 2017-11-11 (×6): qty 1

## 2017-11-11 MED ORDER — MIDODRINE HCL 5 MG PO TABS: 5.0000 mg | ORAL_TABLET | Freq: Three times a day (TID) | ORAL | Status: DC

## 2017-11-11 MED ORDER — SENNOSIDES 8.8 MG/5ML PO SYRP
5.0000 mL | ORAL_SOLUTION | Freq: Two times a day (BID) | ORAL | Status: DC
  Administered 2017-11-11 – 2017-11-12 (×2): 5 mL
  Filled 2017-11-11 (×4): qty 5

## 2017-11-11 MED ORDER — POLYETHYLENE GLYCOL 3350 17 G PO PACK
17.0000 g | PACK | Freq: Two times a day (BID) | ORAL | Status: DC
  Administered 2017-11-11 – 2017-11-12 (×3): 17 g
  Filled 2017-11-11 (×4): qty 1
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
## 2017-11-11 NOTE — Progress Notes (Signed)
Called [facility] about low HR into the low 40s. New for patient, no symptoms at this time. Will continue to monitor closely. Huntley Estelle E, RN 11/11/2017 9:03 AM

## 2017-11-11 NOTE — Progress Notes (Signed)
Pharmacy Antibiotic Note  Malik Brooks is a 62 y.o. male admitted on 11/04/2017 s/p cardiac arrest and found to have MRSE bacteremia   VR = 27   Plan: No vanc  Repeat lvl in a day or 2  Height: 5\' 8"  (172.7 cm) Weight: 283 lb 1.1 oz (128.4 kg) IBW/kg (Calculated) : 68.4  Temp (24hrs), Avg:98 F (36.7 C), Min:97.7 F (36.5 C), Max:98.4 F (36.9 C)  Recent Labs  Lab 11/04/17 1349  11/05/17 2001  11/06/17 0450 11/07/17 0117  11/08/17 0328 11/09/17 0515 11/10/17 0400 11/11/17 0332  WBC  --    < > 11.7*   < >  --  16.4*  --  14.2* 15.0* 17.1* 16.1*  CREATININE  --    < > 5.09*   < >  --  4.83*  --  4.31* 4.13* 3.74* 3.41*  LATICACIDVEN 1.78  --  0.8  --  0.7  --   --   --   --   --   --   VANCOTROUGH  --   --  22*  --   --   --   --   --   --   --   --   VANCORANDOM  --   --   --   --  21  --    < >  --  22  --  27   < > = values in this interval not displayed.    Estimated Creatinine Clearance: 28.6 mL/min (A) (by C-G formula based on SCr of 3.41 mg/dL (H)).    Not on File  [name], PharmD, BCPS, BCCCP Clinical Pharmacist [phone]  Please check AMION for all [facility] Pharmacy numbers  11/11/2017 9:37 AM

## 2017-11-11 NOTE — Progress Notes (Signed)
NAME:  Malik Brooks, MRN:  [id number], DOB:  [birth year], LOS: 7 ADMISSION DATE:  11/04/2017, CONSULTATION DATE:  11/04/2017 REFERRING MD:  EDP Madilyn Hook, CHIEF COMPLAINT:  Cardiac arrest   Brief History   Patient is a 62 year old male [facility] resident with a chronic trach that had some respiratory difficulties in [facility] and suffered a cardiac arrest.  Patient was DNR but received CPR, amiodarone, 2 rounds of epi and a shock with total downtime of 15 minutes. Evidently patient has been going to the urologist for chronic hematuria and requiring frequent transfusion.  In [facility], it was felt that the arrest was a respiratory code and patient was resuscitated despite of being DNR.  Patient evidently is vent dependent in [facility] after a prolonged illness.  Also has history of COPD on inhaled steroids and bronchodilators and has had significant mucous plugging and has required frequent bronchoscopies in the past.  Past Medical History   Significant Hospital Events   10/21 cardiac arrest ? Cause respiratory vs cardiac  Consults: date of consult/date signed off & final recs:  10/22>> Wound Care Nurse 10/22>> Social Work 10/22>> Case Management 10/23>> neurology 10/25>> [facility] Assumed medical care PCCM remains as Trach management 2 times a week  Procedures (surgical and bedside):  CPR 10/21  Significant Diagnostic Tests:  Head CT 10/24>>> increased size of left frontal lobe acute hemorrhage measuring approximately 4.6 cc previously 2.7 cc.  No new acute intracranial abnormality 2.7 cc LEFT frontal intraparenchymal hematoma. No significant mass effect. No CT findings of hypoxic ischemic encephalopathy though if suspicion persists recommend follow up CT HEAD in 24-48 hours.   Brain MRI wo contrast> patient did not tolerate due to anxiety  Micro Data:  Blood 10/21>>> MRSA (contaminate suspected) Urine 10/21>>> enteroccocus sens pending .Marland Kitchen Sputum 10/22>>> (nasopharyngeal  MRSA)  Antimicrobials:  Cefepime 10/21>>> 11/09/2017 Vancoycin 10/21>>>   Subjective:  Comfortable pressure control ventilation Questionable need for fiberoptic endoscopy  Objective   Blood pressure (!) 150/84, pulse 63, temperature 97.8 F (36.6 C), temperature source Oral, resp. rate 20, height 5\' 8"  (1.727 m), weight 128.4 kg, SpO2 100 %.    Vent Mode: PCV FiO2 (%):  [40 %] 40 % Set Rate:  [20 bmp] 20 bmp PEEP:  [5 cmH20] 5 cmH20 Plateau Pressure:  [11 cmH20-19 cmH20] 18 cmH20   Intake/Output Summary (Last 24 hours) at 11/11/2017 0908 Last data filed at 11/11/2017 4098 Gross per 24 hour  Intake 1765 ml  Output 3785 ml  Net -2020 ml   Filed Weights   11/09/17 0500 11/10/17 0500 11/11/17 0320  Weight: 132.9 kg 131.4 kg 128.4 kg    Examination: largely unchanged from prior day  General: Obese male with multiple contractures in no acute distress HEENT: [place]  trach in place.  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  Patient was DNR and suffered a cardiac arrest and was resuscitated and sent to [facility].    Chronic respiratory failure:  Mucus plug left lung> new problem, suspect that his original cardiac arrest was due to something similar Chronic vent at [facility] Chronic trach (  Portex 8 mm cuffed trach) Plan -Not weanable from the vent -Placed on hypertonic saline nebulizers twice daily -Continue mucolytic's -Currently on chest PT -Chest x-Kael reveals left lower lobe atelectasis, subjectively may need fiberoptic bronchoscopy -Continue pressure  control evaluation    Disposition / Summary of Today's Plan 11/11/17   Remains on full ventilatory support with pressure control ventilation Questionable if there is a need for fiberoptic bronchoscopy reported secretions tenacious for last 48 hours.   Labs   CBC: Recent Labs  Lab 11/04/17 1330  11/07/17 0117 11/08/17 0328 11/09/17 0515 11/10/17 0400 11/11/17 0332  WBC 20.8*   < > 16.4* 14.2* 15.0* 17.1* 16.1*  NEUTROABS 19.0*  --   --   --   --   --   --   HGB 6.2*   < > 9.1* 8.3* 8.3* 8.2* 7.9*  HCT 20.5*   < > 29.0* 26.9* 27.8* 25.9* 25.2*  MCV 78.2*   < > 78.8* 80.1 80.1 79.2* 79.2*  PLT 288   < > 255 209 210 198 189   < > = values in this interval not displayed.    Basic Metabolic Panel: Recent Labs  Lab 11/04/17 1730 11/05/17 0435 11/05/17 2001 11/06/17 0358 11/07/17 0117 11/08/17 0328 11/09/17 0515 11/10/17 0400 11/11/17 0332  NA  --  130* 131* 132* 132* 136 138 141 142  K  --  3.8 4.9 4.6 4.2 4.0 3.6 3.3* 4.0  CL  --  100 96* 98 100 105 105 107 109  CO2  --  16* 21* 19* 21* 19* 20* 20* 22  GLUCOSE  --  121* 112* 111* 143* 95 141* 130* 127*  BUN  --  109* 124* 124* 122* 130* 138* 144* 149*  CREATININE  --  4.31* 5.09* 5.06* 4.83* 4.31* 4.13* 3.74* 3.41*  CALCIUM  --  6.3* 7.4* 7.6* 7.6* 7.6* 7.8* 8.0* 8.3*  MG 2.2 1.9 2.5* 2.5*  --   --   --   --   --   PHOS 6.5* 5.4*  --   --   --   --   --  6.3* 6.1*   GFR: Estimated Creatinine Clearance: 28.6 mL/min (A) (by C-G formula based on SCr of 3.41 mg/dL (H)). Recent Labs  Lab 11/04/17 1349 11/04/17 1730  11/05/17 2001  11/06/17 0450  11/08/17 0328 11/09/17 0515 11/10/17 0400 11/11/17 0332  PROCALCITON  --  1.41  --   --   --   --   --   --   --   --   --   WBC  --   --    < > 11.7*   < >  --    < > 14.2* 15.0* 17.1* 16.1*  LATICACIDVEN 1.78  --   --  0.8  --  0.7  --   --   --   --   --    < > = values in this interval not displayed.    Liver Function Tests: Recent Labs  Lab 11/04/17 1330  11/09/17 0515 11/10/17 0400 11/11/17 0332  AST 11* 10* 14*  --   ALT 5 8 8   --   ALKPHOS 70 58 54  --   BILITOT 0.6 0.7 0.6  --   PROT 4.6* 5.2* 5.6*  --   ALBUMIN 1.3* 1.7* 1.7* 1.9*   No results for input(s): LIPASE, AMYLASE in the last 168 hours. No results for input(s): AMMONIA in the last 168 hours.  ABG  Component Value Date/Time   PHART 7.306 (L) 11/06/2017 0446   PCO2ART 43.5 11/06/2017 0446   PO2ART 76.0 (L) 11/06/2017 0446   HCO3 21.8 11/06/2017 0446   TCO2 23 11/06/2017 0446   ACIDBASEDEF 4.0 (H) 11/06/2017 0446   O2SAT 94.0 11/06/2017 0446     Coagulation Profile: Recent Labs  Lab 11/04/17 1330  INR 1.96    Cardiac Enzymes: Recent Labs  Lab 11/05/17 2001 11/06/17 0358 11/06/17 1122 11/06/17 1841 11/07/17 0117  TROPONINI 0.08* 0.05* 0.05* 0.05* <0.03    HbA1C: Hgb A1c MFr Bld  Date/Time Value Ref Range Status  11/08/2017 03:28 AM 6.1 (H) 4.8 - 5.6 % Final    Comment:    (NOTE) Pre diabetes:          5.7%-6.4% Diabetes:              >6.4% Glycemic control for   <7.0% adults with diabetes     CBG: Recent Labs  Lab 11/10/17 1522 11/10/17 1926 11/10/17 2359 11/11/17 0406 11/11/17 0727  GLUCAP 141* 124* 138* 116* 110*       Steve Cheryel Kyte ACNP [facility] Pager [phone] till 1 pm If no answer page [phone] 11/11/2017, 9:08 AM

## 2017-11-11 NOTE — Progress Notes (Signed)
Patient ID: Malik Brooks, male   DOB: 1955/05/25, 62 y.o.   MRN: [id number]  Asked to evaluate leaking G tube  G tube was placed in [place] per family Several weeks ago  Now in pt at [facility] G tube has been leaking since yesterday per RN Feeds turned off  Upon evaluation Bumper of G tube is 2 cm from skin site  Cinched to skin-- pulled taut G tube still at 4-5 cm   Showed RN system and explained needs for cinched to skin  If continues to leak Consider exchange to IR G tube

## 2017-11-11 NOTE — Progress Notes (Signed)
Called [facility] regarding PEG tube leaking around site with yellow, tan, and green secretions coming out. TF and medicines stopped at this time. Will continue to monitor closely. Huntley Estelle E, RN 11/11/2017 1210

## 2017-11-12 ENCOUNTER — Inpatient Hospital Stay (HOSPITAL_COMMUNITY): Payer: Medicare Other

## 2017-11-12 DIAGNOSIS — T80219S Unspecified infection due to central venous catheter, sequela: Secondary | ICD-10-CM

## 2017-11-12 LAB — RENAL FUNCTION PANEL
ALBUMIN: 2 g/dL — AB (ref 3.5–5.0)
Anion gap: 13 (ref 5–15)
BUN: 143 mg/dL — ABNORMAL HIGH (ref 8–23)
CO2: 22 mmol/L (ref 22–32)
Calcium: 8.5 mg/dL — ABNORMAL LOW (ref 8.9–10.3)
Chloride: 112 mmol/L — ABNORMAL HIGH (ref 98–111)
Creatinine, Ser: 2.94 mg/dL — ABNORMAL HIGH (ref 0.61–1.24)
GFR calc non Af Amer: 21 mL/min — ABNORMAL LOW (ref >60)
GFR, EST AFRICAN AMERICAN: 24 mL/min — AB (ref >60)
Glucose, Bld: 143 mg/dL — ABNORMAL HIGH (ref 70–99)
PHOSPHORUS: 5.5 mg/dL — AB (ref 2.5–4.6)
Potassium: 2.5 mmol/L — CL (ref 3.5–5.1)
Sodium: 147 mmol/L — ABNORMAL HIGH (ref 135–145)

## 2017-11-12 LAB — GLUCOSE, CAPILLARY
GLUCOSE-CAPILLARY: 143 mg/dL — AB (ref 70–99)
Glucose-Capillary: 133 mg/dL — ABNORMAL HIGH (ref 70–99)
Glucose-Capillary: 132 mg/dL — ABNORMAL HIGH (ref 70–99)
Glucose-Capillary: 161 mg/dL — ABNORMAL HIGH (ref 70–99)

## 2017-11-12 LAB — BASIC METABOLIC PANEL
Anion gap: 17 — ABNORMAL HIGH (ref 5–15)
BUN: 143 mg/dL — ABNORMAL HIGH (ref 8–23)
CALCIUM: 8.4 mg/dL — AB (ref 8.9–10.3)
CO2: 16 mmol/L — ABNORMAL LOW (ref 22–32)
CREATININE: 3.04 mg/dL — AB (ref 0.61–1.24)
Chloride: 115 mmol/L — ABNORMAL HIGH (ref 98–111)
GFR calc Af Amer: 23 mL/min — ABNORMAL LOW (ref >60)
GFR, EST NON AFRICAN AMERICAN: 20 mL/min — AB (ref >60)
GLUCOSE: 146 mg/dL — AB (ref 70–99)
Potassium: 3.2 mmol/L — ABNORMAL LOW (ref 3.5–5.1)
Sodium: 148 mmol/L — ABNORMAL HIGH (ref 135–145)

## 2017-11-12 LAB — CBC
HEMATOCRIT: 23.8 % — AB (ref 39.0–52.0)
HEMOGLOBIN: 7.6 g/dL — AB (ref 13.0–17.0)
MCH: 25.2 pg — ABNORMAL LOW (ref 26.0–34.0)
MCHC: 31.9 g/dL (ref 30.0–36.0)
MCV: 79.1 fL — ABNORMAL LOW (ref 80.0–100.0)
NRBC: 0 % (ref 0.0–0.2)
Platelets: 162 10*3/uL (ref 150–400)
RBC: 3.01 MIL/uL — ABNORMAL LOW (ref 4.22–5.81)
RDW: 20.7 % — AB (ref 11.5–15.5)
WBC: 13.8 10*3/uL — AB (ref 4.0–10.5)

## 2017-11-12 MED ORDER — OXYCODONE HCL 5 MG PO TABS: 5.0000 mg | ORAL_TABLET | ORAL | 0 refills | Status: AC | PRN

## 2017-11-12 MED ORDER — SENNOSIDES 8.8 MG/5ML PO SYRP: 5.0000 mL | ORAL_SOLUTION | Freq: Two times a day (BID) | ORAL | 0 refills | Status: AC

## 2017-11-12 MED ORDER — ONDANSETRON HCL 4 MG/2ML IJ SOLN: 4.0000 mg | Freq: Two times a day (BID) | INTRAMUSCULAR | 0 refills | Status: AC | PRN

## 2017-11-12 MED ORDER — ADULT MULTIVITAMIN LIQUID CH: 15.0000 mL | Freq: Every day | ORAL | Status: AC

## 2017-11-12 MED ORDER — INSULIN ASPART 100 UNIT/ML ~~LOC~~ SOLN: 0.0000 [IU] | SUBCUTANEOUS | 11 refills | Status: AC

## 2017-11-12 MED ORDER — IPRATROPIUM-ALBUTEROL 0.5-2.5 (3) MG/3ML IN SOLN: 3.0000 mL | Freq: Four times a day (QID) | RESPIRATORY_TRACT | Status: AC

## 2017-11-12 MED ORDER — FENTANYL CITRATE (PF) 100 MCG/2ML IJ SOLN: 12.5000 ug | INTRAMUSCULAR | 0 refills | Status: AC | PRN

## 2017-11-12 MED ORDER — POTASSIUM CHLORIDE 20 MEQ/15ML (10%) PO SOLN
40.0000 meq | Freq: Once | ORAL | Status: AC
  Administered 2017-11-12: 40 meq
  Filled 2017-11-12: qty 30

## 2017-11-12 MED ORDER — ORAL CARE MOUTH RINSE: 15.0000 mL | OROMUCOSAL | 0 refills | Status: AC

## 2017-11-12 MED ORDER — POLYETHYLENE GLYCOL 3350 17 G PO PACK: 17.0000 g | PACK | Freq: Two times a day (BID) | ORAL | 0 refills | Status: AC

## 2017-11-12 MED ORDER — ENSURE ENLIVE PO LIQD: 237.0000 mL | Freq: Two times a day (BID) | ORAL | 12 refills | Status: AC | PRN

## 2017-11-12 MED ORDER — PRO-STAT SUGAR FREE PO LIQD: 30.0000 mL | Freq: Four times a day (QID) | ORAL | 0 refills | Status: AC

## 2017-11-12 MED ORDER — LANTHANUM CARBONATE 500 MG PO CHEW: 500.0000 mg | CHEWABLE_TABLET | Freq: Three times a day (TID) | ORAL | Status: AC

## 2017-11-12 MED ORDER — FAMOTIDINE 40 MG/5ML PO SUSR: 20.0000 mg | Freq: Every day | ORAL | 0 refills | Status: AC

## 2017-11-12 MED ORDER — SODIUM CHLORIDE 0.9 % IV SOLN: 10.0000 mL | INTRAVENOUS | 0 refills | Status: AC

## 2017-11-12 MED ORDER — GUAIFENESIN 100 MG/5ML PO SOLN: 10.0000 mL | Freq: Four times a day (QID) | ORAL | 0 refills | Status: AC

## 2017-11-12 MED ORDER — ACETAMINOPHEN 160 MG/5ML PO SOLN: 650.0000 mg | Freq: Four times a day (QID) | ORAL | 0 refills | Status: AC | PRN

## 2017-11-12 MED ORDER — CHLORHEXIDINE GLUCONATE 0.12% ORAL RINSE (MEDLINE KIT): 15.0000 mL | Freq: Two times a day (BID) | OROMUCOSAL | 0 refills | Status: AC

## 2017-11-12 MED ORDER — VITAL HIGH PROTEIN PO LIQD: 1000.0000 mL | ORAL | Status: AC

## 2017-11-12 MED ORDER — PROMETHAZINE HCL 25 MG/ML IJ SOLN: 12.5000 mg | Freq: Four times a day (QID) | INTRAMUSCULAR | 0 refills | Status: AC | PRN

## 2017-11-12 NOTE — Progress Notes (Signed)
CSW spoke with [facility] representative on last week to inquire about beds as well if pt can return to facility once medically stable. CSW was informed to call back once it gets closer to pt discharging to see. CSW agreeable to call back once pt is closer to discharging.    Claude Manges Johnathan Tortorelli, MSW, LCSW-A Emergency Department Clinical Social Worker [phone]

## 2017-11-12 NOTE — Discharge Instructions (Signed)
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
## 2017-11-12 NOTE — Progress Notes (Signed)
eLink Physician-Brief Progress Note Patient Name: Chrisopher Pustejovsky DOB: 12-Nov-1955 MRN: [id number]   Date of Service  11/12/2017  HPI/Events of Note  K+ = 2.5 and Creatinine = 2.94.   eICU Interventions  Will order: 1. Replace K+.  2. BMP at 12 noon.      Intervention Category Major Interventions: Electrolyte abnormality - evaluation and management  Sommer,Steven Eugene 11/12/2017, 5:09 AM

## 2017-11-12 NOTE — Care Management (Addendum)
11/12/2017  Pt has been deemed medically stable for discharge to [facility] today.  CM communicated with (POA) niece [name] that pt has been discharged today and will transport to [facility] - Bedside nurse served as witness that niece remains in agreement. CM gave transport package to bedside nurse.  Bedside nurse will call report to [facility]  [facility] has offered bed when stable.  [facility] is not able to offer bed.  Attending made aware.  POA In agreement for pt to discharge to [facility].  11/08/17 Attending in agreement with [facility] discharge when appropriate.  CM discussed LTACH options with POA - POA chose [facility] and informed CM she will only entertain [facility] if [facility] can not offer a bed.  [facility] would like to revisit for appropriateness on Monday 10/28.  Attending has not yet deemed pt stable for discharge due to new onset of infection - ID consulted.  CM also made tentative referral to [facility]  CM requested LTACH consideration from attending.    11/06/17 CM discussed LTACH with attending.  Pt now having renal failure - referral not appropriate at this time  Pt is from [facility] - pt is chronic trach now with resp difficulties requiring continuous vent.  CM requested LTACH to review chart to determine if pt may be appropriate for [facility] discharge

## 2017-11-12 NOTE — Care Management Important Message (Signed)
Important Message  Patient Details  Name: Malik Brooks MRN: [id number] Date of Birth: 06/10/1955   Medicare Important Message Given:   CM reviewed IM notice with POA [name] over the phone - bedside nurse Dustin Flock second verified that IM was delivered via phone    Cherylann Parr, RN 11/12/2017, 4:33 PM

## 2017-11-13 LAB — CULTURE, BLOOD (ROUTINE X 2)
CULTURE: NO GROWTH
Culture: NO GROWTH
Special Requests: ADEQUATE
Special Requests: ADEQUATE

## 2017-11-18 DIAGNOSIS — I482 Chronic atrial fibrillation, unspecified: Secondary | ICD-10-CM

## 2017-11-18 DIAGNOSIS — J9621 Acute and chronic respiratory failure with hypoxia: Secondary | ICD-10-CM

## 2017-11-18 DIAGNOSIS — J9811 Atelectasis: Secondary | ICD-10-CM

## 2017-11-18 DIAGNOSIS — J449 Chronic obstructive pulmonary disease, unspecified: Secondary | ICD-10-CM

## 2017-11-18 DIAGNOSIS — G8220 Paraplegia, unspecified: Secondary | ICD-10-CM

## 2017-11-18 DIAGNOSIS — I82409 Acute embolism and thrombosis of unspecified deep veins of unspecified lower extremity: Secondary | ICD-10-CM

## 2017-11-19 DIAGNOSIS — I82409 Acute embolism and thrombosis of unspecified deep veins of unspecified lower extremity: Secondary | ICD-10-CM

## 2017-11-19 DIAGNOSIS — G8220 Paraplegia, unspecified: Secondary | ICD-10-CM

## 2017-11-19 DIAGNOSIS — J449 Chronic obstructive pulmonary disease, unspecified: Secondary | ICD-10-CM

## 2017-11-19 DIAGNOSIS — J9621 Acute and chronic respiratory failure with hypoxia: Secondary | ICD-10-CM

## 2017-11-19 DIAGNOSIS — I482 Chronic atrial fibrillation, unspecified: Secondary | ICD-10-CM

## 2017-11-19 DIAGNOSIS — J9811 Atelectasis: Secondary | ICD-10-CM

## 2017-11-20 DIAGNOSIS — J449 Chronic obstructive pulmonary disease, unspecified: Secondary | ICD-10-CM

## 2017-11-20 DIAGNOSIS — J9811 Atelectasis: Secondary | ICD-10-CM

## 2017-11-20 DIAGNOSIS — I82409 Acute embolism and thrombosis of unspecified deep veins of unspecified lower extremity: Secondary | ICD-10-CM

## 2017-11-20 DIAGNOSIS — J9621 Acute and chronic respiratory failure with hypoxia: Secondary | ICD-10-CM

## 2017-11-20 DIAGNOSIS — G8220 Paraplegia, unspecified: Secondary | ICD-10-CM

## 2017-11-20 DIAGNOSIS — I482 Chronic atrial fibrillation, unspecified: Secondary | ICD-10-CM

## 2017-11-21 DIAGNOSIS — G8220 Paraplegia, unspecified: Secondary | ICD-10-CM

## 2017-11-21 DIAGNOSIS — I82409 Acute embolism and thrombosis of unspecified deep veins of unspecified lower extremity: Secondary | ICD-10-CM

## 2017-11-21 DIAGNOSIS — J9811 Atelectasis: Secondary | ICD-10-CM

## 2017-11-21 DIAGNOSIS — I482 Chronic atrial fibrillation, unspecified: Secondary | ICD-10-CM

## 2017-11-21 DIAGNOSIS — J9621 Acute and chronic respiratory failure with hypoxia: Secondary | ICD-10-CM

## 2017-11-21 DIAGNOSIS — J449 Chronic obstructive pulmonary disease, unspecified: Secondary | ICD-10-CM

## 2017-11-22 DIAGNOSIS — I482 Chronic atrial fibrillation, unspecified: Secondary | ICD-10-CM

## 2017-11-22 DIAGNOSIS — J449 Chronic obstructive pulmonary disease, unspecified: Secondary | ICD-10-CM

## 2017-11-22 DIAGNOSIS — G8220 Paraplegia, unspecified: Secondary | ICD-10-CM

## 2017-11-22 DIAGNOSIS — J9621 Acute and chronic respiratory failure with hypoxia: Secondary | ICD-10-CM

## 2017-11-22 DIAGNOSIS — I82409 Acute embolism and thrombosis of unspecified deep veins of unspecified lower extremity: Secondary | ICD-10-CM

## 2017-11-22 DIAGNOSIS — J9811 Atelectasis: Secondary | ICD-10-CM

## 2017-11-23 DIAGNOSIS — J449 Chronic obstructive pulmonary disease, unspecified: Secondary | ICD-10-CM

## 2017-11-23 DIAGNOSIS — G8220 Paraplegia, unspecified: Secondary | ICD-10-CM

## 2017-11-23 DIAGNOSIS — J9811 Atelectasis: Secondary | ICD-10-CM

## 2017-11-23 DIAGNOSIS — I82409 Acute embolism and thrombosis of unspecified deep veins of unspecified lower extremity: Secondary | ICD-10-CM

## 2017-11-23 DIAGNOSIS — I482 Chronic atrial fibrillation, unspecified: Secondary | ICD-10-CM

## 2017-11-23 DIAGNOSIS — J9621 Acute and chronic respiratory failure with hypoxia: Secondary | ICD-10-CM

## 2017-11-24 DIAGNOSIS — G8220 Paraplegia, unspecified: Secondary | ICD-10-CM

## 2017-11-24 DIAGNOSIS — J449 Chronic obstructive pulmonary disease, unspecified: Secondary | ICD-10-CM

## 2017-11-24 DIAGNOSIS — I82409 Acute embolism and thrombosis of unspecified deep veins of unspecified lower extremity: Secondary | ICD-10-CM

## 2017-11-24 DIAGNOSIS — I482 Chronic atrial fibrillation, unspecified: Secondary | ICD-10-CM

## 2017-11-24 DIAGNOSIS — J9621 Acute and chronic respiratory failure with hypoxia: Secondary | ICD-10-CM

## 2017-11-24 DIAGNOSIS — J9811 Atelectasis: Secondary | ICD-10-CM

## 2017-12-02 DIAGNOSIS — I82409 Acute embolism and thrombosis of unspecified deep veins of unspecified lower extremity: Secondary | ICD-10-CM

## 2017-12-02 DIAGNOSIS — G8220 Paraplegia, unspecified: Secondary | ICD-10-CM

## 2017-12-02 DIAGNOSIS — J9811 Atelectasis: Secondary | ICD-10-CM

## 2017-12-02 DIAGNOSIS — J9621 Acute and chronic respiratory failure with hypoxia: Secondary | ICD-10-CM

## 2017-12-02 DIAGNOSIS — I482 Chronic atrial fibrillation, unspecified: Secondary | ICD-10-CM

## 2017-12-02 DIAGNOSIS — J449 Chronic obstructive pulmonary disease, unspecified: Secondary | ICD-10-CM

## 2017-12-03 DIAGNOSIS — J9621 Acute and chronic respiratory failure with hypoxia: Secondary | ICD-10-CM

## 2017-12-03 DIAGNOSIS — J9811 Atelectasis: Secondary | ICD-10-CM

## 2017-12-03 DIAGNOSIS — J449 Chronic obstructive pulmonary disease, unspecified: Secondary | ICD-10-CM

## 2017-12-03 DIAGNOSIS — G8220 Paraplegia, unspecified: Secondary | ICD-10-CM

## 2017-12-03 DIAGNOSIS — I482 Chronic atrial fibrillation, unspecified: Secondary | ICD-10-CM

## 2017-12-03 DIAGNOSIS — I82409 Acute embolism and thrombosis of unspecified deep veins of unspecified lower extremity: Secondary | ICD-10-CM

## 2017-12-04 DIAGNOSIS — I482 Chronic atrial fibrillation, unspecified: Secondary | ICD-10-CM

## 2017-12-04 DIAGNOSIS — J9621 Acute and chronic respiratory failure with hypoxia: Secondary | ICD-10-CM

## 2017-12-04 DIAGNOSIS — J449 Chronic obstructive pulmonary disease, unspecified: Secondary | ICD-10-CM

## 2017-12-04 DIAGNOSIS — G8220 Paraplegia, unspecified: Secondary | ICD-10-CM

## 2017-12-04 DIAGNOSIS — J9811 Atelectasis: Secondary | ICD-10-CM

## 2017-12-04 DIAGNOSIS — I82409 Acute embolism and thrombosis of unspecified deep veins of unspecified lower extremity: Secondary | ICD-10-CM

## 2017-12-05 DIAGNOSIS — G8220 Paraplegia, unspecified: Secondary | ICD-10-CM

## 2017-12-05 DIAGNOSIS — J9811 Atelectasis: Secondary | ICD-10-CM

## 2017-12-05 DIAGNOSIS — I82409 Acute embolism and thrombosis of unspecified deep veins of unspecified lower extremity: Secondary | ICD-10-CM

## 2017-12-05 DIAGNOSIS — J449 Chronic obstructive pulmonary disease, unspecified: Secondary | ICD-10-CM

## 2017-12-05 DIAGNOSIS — I482 Chronic atrial fibrillation, unspecified: Secondary | ICD-10-CM

## 2017-12-05 DIAGNOSIS — J9621 Acute and chronic respiratory failure with hypoxia: Secondary | ICD-10-CM

## 2017-12-06 DIAGNOSIS — J9621 Acute and chronic respiratory failure with hypoxia: Secondary | ICD-10-CM

## 2017-12-06 DIAGNOSIS — J449 Chronic obstructive pulmonary disease, unspecified: Secondary | ICD-10-CM

## 2017-12-06 DIAGNOSIS — J9811 Atelectasis: Secondary | ICD-10-CM

## 2017-12-06 DIAGNOSIS — I482 Chronic atrial fibrillation, unspecified: Secondary | ICD-10-CM

## 2017-12-06 DIAGNOSIS — I82409 Acute embolism and thrombosis of unspecified deep veins of unspecified lower extremity: Secondary | ICD-10-CM

## 2017-12-06 DIAGNOSIS — G8220 Paraplegia, unspecified: Secondary | ICD-10-CM

## 2017-12-07 DIAGNOSIS — J449 Chronic obstructive pulmonary disease, unspecified: Secondary | ICD-10-CM

## 2017-12-07 DIAGNOSIS — I82409 Acute embolism and thrombosis of unspecified deep veins of unspecified lower extremity: Secondary | ICD-10-CM

## 2017-12-07 DIAGNOSIS — J9621 Acute and chronic respiratory failure with hypoxia: Secondary | ICD-10-CM

## 2017-12-07 DIAGNOSIS — G8220 Paraplegia, unspecified: Secondary | ICD-10-CM

## 2017-12-07 DIAGNOSIS — J9811 Atelectasis: Secondary | ICD-10-CM

## 2017-12-07 DIAGNOSIS — I482 Chronic atrial fibrillation, unspecified: Secondary | ICD-10-CM

## 2017-12-08 DIAGNOSIS — J9811 Atelectasis: Secondary | ICD-10-CM

## 2017-12-08 DIAGNOSIS — I82409 Acute embolism and thrombosis of unspecified deep veins of unspecified lower extremity: Secondary | ICD-10-CM

## 2017-12-08 DIAGNOSIS — J449 Chronic obstructive pulmonary disease, unspecified: Secondary | ICD-10-CM

## 2017-12-08 DIAGNOSIS — I482 Chronic atrial fibrillation, unspecified: Secondary | ICD-10-CM

## 2017-12-08 DIAGNOSIS — J9621 Acute and chronic respiratory failure with hypoxia: Secondary | ICD-10-CM

## 2017-12-08 DIAGNOSIS — G8220 Paraplegia, unspecified: Secondary | ICD-10-CM

## 2017-12-16 DIAGNOSIS — I482 Chronic atrial fibrillation, unspecified: Secondary | ICD-10-CM

## 2017-12-16 DIAGNOSIS — I82409 Acute embolism and thrombosis of unspecified deep veins of unspecified lower extremity: Secondary | ICD-10-CM

## 2017-12-16 DIAGNOSIS — G8220 Paraplegia, unspecified: Secondary | ICD-10-CM

## 2017-12-16 DIAGNOSIS — J9811 Atelectasis: Secondary | ICD-10-CM

## 2017-12-16 DIAGNOSIS — J449 Chronic obstructive pulmonary disease, unspecified: Secondary | ICD-10-CM

## 2017-12-16 DIAGNOSIS — J9621 Acute and chronic respiratory failure with hypoxia: Secondary | ICD-10-CM

## 2017-12-17 DIAGNOSIS — I482 Chronic atrial fibrillation, unspecified: Secondary | ICD-10-CM

## 2017-12-17 DIAGNOSIS — G8220 Paraplegia, unspecified: Secondary | ICD-10-CM

## 2017-12-17 DIAGNOSIS — I82409 Acute embolism and thrombosis of unspecified deep veins of unspecified lower extremity: Secondary | ICD-10-CM

## 2017-12-17 DIAGNOSIS — J9621 Acute and chronic respiratory failure with hypoxia: Secondary | ICD-10-CM

## 2017-12-17 DIAGNOSIS — J9811 Atelectasis: Secondary | ICD-10-CM

## 2017-12-17 DIAGNOSIS — J449 Chronic obstructive pulmonary disease, unspecified: Secondary | ICD-10-CM

## 2017-12-18 DIAGNOSIS — G8220 Paraplegia, unspecified: Secondary | ICD-10-CM

## 2017-12-18 DIAGNOSIS — I482 Chronic atrial fibrillation, unspecified: Secondary | ICD-10-CM

## 2017-12-18 DIAGNOSIS — I82409 Acute embolism and thrombosis of unspecified deep veins of unspecified lower extremity: Secondary | ICD-10-CM

## 2017-12-18 DIAGNOSIS — J9621 Acute and chronic respiratory failure with hypoxia: Secondary | ICD-10-CM

## 2017-12-18 DIAGNOSIS — J9811 Atelectasis: Secondary | ICD-10-CM

## 2017-12-18 DIAGNOSIS — J449 Chronic obstructive pulmonary disease, unspecified: Secondary | ICD-10-CM

## 2017-12-19 DIAGNOSIS — J449 Chronic obstructive pulmonary disease, unspecified: Secondary | ICD-10-CM

## 2017-12-19 DIAGNOSIS — J9811 Atelectasis: Secondary | ICD-10-CM

## 2017-12-19 DIAGNOSIS — I82409 Acute embolism and thrombosis of unspecified deep veins of unspecified lower extremity: Secondary | ICD-10-CM

## 2017-12-19 DIAGNOSIS — G8220 Paraplegia, unspecified: Secondary | ICD-10-CM

## 2017-12-19 DIAGNOSIS — J9621 Acute and chronic respiratory failure with hypoxia: Secondary | ICD-10-CM

## 2017-12-19 DIAGNOSIS — I482 Chronic atrial fibrillation, unspecified: Secondary | ICD-10-CM

## 2017-12-20 DIAGNOSIS — I482 Chronic atrial fibrillation, unspecified: Secondary | ICD-10-CM

## 2017-12-20 DIAGNOSIS — I82409 Acute embolism and thrombosis of unspecified deep veins of unspecified lower extremity: Secondary | ICD-10-CM

## 2017-12-20 DIAGNOSIS — G8220 Paraplegia, unspecified: Secondary | ICD-10-CM

## 2017-12-20 DIAGNOSIS — J9621 Acute and chronic respiratory failure with hypoxia: Secondary | ICD-10-CM

## 2017-12-20 DIAGNOSIS — J449 Chronic obstructive pulmonary disease, unspecified: Secondary | ICD-10-CM

## 2017-12-20 DIAGNOSIS — J9811 Atelectasis: Secondary | ICD-10-CM

## 2017-12-21 DIAGNOSIS — I482 Chronic atrial fibrillation, unspecified: Secondary | ICD-10-CM

## 2017-12-21 DIAGNOSIS — J9621 Acute and chronic respiratory failure with hypoxia: Secondary | ICD-10-CM

## 2017-12-21 DIAGNOSIS — J449 Chronic obstructive pulmonary disease, unspecified: Secondary | ICD-10-CM

## 2017-12-21 DIAGNOSIS — J9811 Atelectasis: Secondary | ICD-10-CM

## 2017-12-21 DIAGNOSIS — I82409 Acute embolism and thrombosis of unspecified deep veins of unspecified lower extremity: Secondary | ICD-10-CM

## 2017-12-21 DIAGNOSIS — G8220 Paraplegia, unspecified: Secondary | ICD-10-CM

## 2017-12-22 DIAGNOSIS — J9811 Atelectasis: Secondary | ICD-10-CM

## 2017-12-22 DIAGNOSIS — J9621 Acute and chronic respiratory failure with hypoxia: Secondary | ICD-10-CM

## 2017-12-22 DIAGNOSIS — G8220 Paraplegia, unspecified: Secondary | ICD-10-CM

## 2017-12-22 DIAGNOSIS — J449 Chronic obstructive pulmonary disease, unspecified: Secondary | ICD-10-CM

## 2017-12-22 DIAGNOSIS — I482 Chronic atrial fibrillation, unspecified: Secondary | ICD-10-CM

## 2017-12-22 DIAGNOSIS — I82409 Acute embolism and thrombosis of unspecified deep veins of unspecified lower extremity: Secondary | ICD-10-CM

## 2017-12-30 DIAGNOSIS — G8220 Paraplegia, unspecified: Secondary | ICD-10-CM

## 2017-12-30 DIAGNOSIS — I82409 Acute embolism and thrombosis of unspecified deep veins of unspecified lower extremity: Secondary | ICD-10-CM

## 2017-12-30 DIAGNOSIS — J9811 Atelectasis: Secondary | ICD-10-CM

## 2017-12-30 DIAGNOSIS — J9621 Acute and chronic respiratory failure with hypoxia: Secondary | ICD-10-CM

## 2017-12-30 DIAGNOSIS — J449 Chronic obstructive pulmonary disease, unspecified: Secondary | ICD-10-CM

## 2017-12-30 DIAGNOSIS — I482 Chronic atrial fibrillation, unspecified: Secondary | ICD-10-CM

## 2017-12-31 DIAGNOSIS — I482 Chronic atrial fibrillation, unspecified: Secondary | ICD-10-CM

## 2017-12-31 DIAGNOSIS — J9621 Acute and chronic respiratory failure with hypoxia: Secondary | ICD-10-CM

## 2017-12-31 DIAGNOSIS — G8220 Paraplegia, unspecified: Secondary | ICD-10-CM

## 2017-12-31 DIAGNOSIS — J449 Chronic obstructive pulmonary disease, unspecified: Secondary | ICD-10-CM

## 2017-12-31 DIAGNOSIS — J9811 Atelectasis: Secondary | ICD-10-CM

## 2017-12-31 DIAGNOSIS — I82409 Acute embolism and thrombosis of unspecified deep veins of unspecified lower extremity: Secondary | ICD-10-CM

## 2018-01-01 DIAGNOSIS — I482 Chronic atrial fibrillation, unspecified: Secondary | ICD-10-CM

## 2018-01-01 DIAGNOSIS — J449 Chronic obstructive pulmonary disease, unspecified: Secondary | ICD-10-CM

## 2018-01-01 DIAGNOSIS — J9811 Atelectasis: Secondary | ICD-10-CM

## 2018-01-01 DIAGNOSIS — J9621 Acute and chronic respiratory failure with hypoxia: Secondary | ICD-10-CM

## 2018-01-01 DIAGNOSIS — I82409 Acute embolism and thrombosis of unspecified deep veins of unspecified lower extremity: Secondary | ICD-10-CM

## 2018-01-01 DIAGNOSIS — G8220 Paraplegia, unspecified: Secondary | ICD-10-CM

## 2018-01-02 DIAGNOSIS — I82409 Acute embolism and thrombosis of unspecified deep veins of unspecified lower extremity: Secondary | ICD-10-CM

## 2018-01-02 DIAGNOSIS — I482 Chronic atrial fibrillation, unspecified: Secondary | ICD-10-CM

## 2018-01-02 DIAGNOSIS — G8220 Paraplegia, unspecified: Secondary | ICD-10-CM

## 2018-01-02 DIAGNOSIS — J9811 Atelectasis: Secondary | ICD-10-CM

## 2018-01-02 DIAGNOSIS — J449 Chronic obstructive pulmonary disease, unspecified: Secondary | ICD-10-CM

## 2018-01-02 DIAGNOSIS — J9621 Acute and chronic respiratory failure with hypoxia: Secondary | ICD-10-CM

## 2018-01-03 DIAGNOSIS — I482 Chronic atrial fibrillation, unspecified: Secondary | ICD-10-CM

## 2018-01-03 DIAGNOSIS — G8220 Paraplegia, unspecified: Secondary | ICD-10-CM

## 2018-01-03 DIAGNOSIS — J449 Chronic obstructive pulmonary disease, unspecified: Secondary | ICD-10-CM

## 2018-01-03 DIAGNOSIS — J9811 Atelectasis: Secondary | ICD-10-CM

## 2018-01-03 DIAGNOSIS — J9621 Acute and chronic respiratory failure with hypoxia: Secondary | ICD-10-CM

## 2018-01-03 DIAGNOSIS — I82409 Acute embolism and thrombosis of unspecified deep veins of unspecified lower extremity: Secondary | ICD-10-CM

## 2018-01-04 DIAGNOSIS — J9811 Atelectasis: Secondary | ICD-10-CM

## 2018-01-04 DIAGNOSIS — I82409 Acute embolism and thrombosis of unspecified deep veins of unspecified lower extremity: Secondary | ICD-10-CM

## 2018-01-04 DIAGNOSIS — G8220 Paraplegia, unspecified: Secondary | ICD-10-CM

## 2018-01-04 DIAGNOSIS — I482 Chronic atrial fibrillation, unspecified: Secondary | ICD-10-CM

## 2018-01-04 DIAGNOSIS — J9621 Acute and chronic respiratory failure with hypoxia: Secondary | ICD-10-CM

## 2018-01-04 DIAGNOSIS — J449 Chronic obstructive pulmonary disease, unspecified: Secondary | ICD-10-CM

## 2018-01-05 DIAGNOSIS — J449 Chronic obstructive pulmonary disease, unspecified: Secondary | ICD-10-CM

## 2018-01-05 DIAGNOSIS — I482 Chronic atrial fibrillation, unspecified: Secondary | ICD-10-CM

## 2018-01-05 DIAGNOSIS — J9811 Atelectasis: Secondary | ICD-10-CM

## 2018-01-05 DIAGNOSIS — J9621 Acute and chronic respiratory failure with hypoxia: Secondary | ICD-10-CM

## 2018-01-05 DIAGNOSIS — G8220 Paraplegia, unspecified: Secondary | ICD-10-CM

## 2018-01-05 DIAGNOSIS — I82409 Acute embolism and thrombosis of unspecified deep veins of unspecified lower extremity: Secondary | ICD-10-CM

## 2018-01-06 DIAGNOSIS — J9811 Atelectasis: Secondary | ICD-10-CM

## 2018-01-06 DIAGNOSIS — J9621 Acute and chronic respiratory failure with hypoxia: Secondary | ICD-10-CM

## 2018-01-06 DIAGNOSIS — G8220 Paraplegia, unspecified: Secondary | ICD-10-CM

## 2018-01-06 DIAGNOSIS — I482 Chronic atrial fibrillation, unspecified: Secondary | ICD-10-CM

## 2018-01-06 DIAGNOSIS — I82409 Acute embolism and thrombosis of unspecified deep veins of unspecified lower extremity: Secondary | ICD-10-CM

## 2018-01-06 DIAGNOSIS — J449 Chronic obstructive pulmonary disease, unspecified: Secondary | ICD-10-CM

## 2018-01-07 DIAGNOSIS — G8220 Paraplegia, unspecified: Secondary | ICD-10-CM

## 2018-01-07 DIAGNOSIS — I482 Chronic atrial fibrillation, unspecified: Secondary | ICD-10-CM

## 2018-01-07 DIAGNOSIS — I82409 Acute embolism and thrombosis of unspecified deep veins of unspecified lower extremity: Secondary | ICD-10-CM

## 2018-01-07 DIAGNOSIS — J9811 Atelectasis: Secondary | ICD-10-CM

## 2018-01-07 DIAGNOSIS — J9621 Acute and chronic respiratory failure with hypoxia: Secondary | ICD-10-CM

## 2018-01-07 DIAGNOSIS — J449 Chronic obstructive pulmonary disease, unspecified: Secondary | ICD-10-CM

## 2018-01-08 DIAGNOSIS — J449 Chronic obstructive pulmonary disease, unspecified: Secondary | ICD-10-CM

## 2018-01-08 DIAGNOSIS — I482 Chronic atrial fibrillation, unspecified: Secondary | ICD-10-CM

## 2018-01-08 DIAGNOSIS — J9621 Acute and chronic respiratory failure with hypoxia: Secondary | ICD-10-CM

## 2018-01-08 DIAGNOSIS — J9811 Atelectasis: Secondary | ICD-10-CM

## 2018-01-08 DIAGNOSIS — G8220 Paraplegia, unspecified: Secondary | ICD-10-CM

## 2018-01-08 DIAGNOSIS — I82409 Acute embolism and thrombosis of unspecified deep veins of unspecified lower extremity: Secondary | ICD-10-CM

## 2018-01-16 DIAGNOSIS — J449 Chronic obstructive pulmonary disease, unspecified: Secondary | ICD-10-CM

## 2018-01-16 DIAGNOSIS — J9621 Acute and chronic respiratory failure with hypoxia: Secondary | ICD-10-CM

## 2018-01-16 DIAGNOSIS — G8220 Paraplegia, unspecified: Secondary | ICD-10-CM

## 2018-01-16 DIAGNOSIS — I482 Chronic atrial fibrillation, unspecified: Secondary | ICD-10-CM

## 2018-01-16 DIAGNOSIS — J9811 Atelectasis: Secondary | ICD-10-CM

## 2018-01-16 DIAGNOSIS — I82409 Acute embolism and thrombosis of unspecified deep veins of unspecified lower extremity: Secondary | ICD-10-CM

## 2018-01-17 DIAGNOSIS — I482 Chronic atrial fibrillation, unspecified: Secondary | ICD-10-CM

## 2018-01-17 DIAGNOSIS — G8220 Paraplegia, unspecified: Secondary | ICD-10-CM

## 2018-01-17 DIAGNOSIS — I82409 Acute embolism and thrombosis of unspecified deep veins of unspecified lower extremity: Secondary | ICD-10-CM

## 2018-01-17 DIAGNOSIS — J449 Chronic obstructive pulmonary disease, unspecified: Secondary | ICD-10-CM

## 2018-01-17 DIAGNOSIS — J9811 Atelectasis: Secondary | ICD-10-CM

## 2018-01-17 DIAGNOSIS — J9621 Acute and chronic respiratory failure with hypoxia: Secondary | ICD-10-CM

## 2018-01-18 DIAGNOSIS — J449 Chronic obstructive pulmonary disease, unspecified: Secondary | ICD-10-CM

## 2018-01-18 DIAGNOSIS — G8220 Paraplegia, unspecified: Secondary | ICD-10-CM

## 2018-01-18 DIAGNOSIS — J9621 Acute and chronic respiratory failure with hypoxia: Secondary | ICD-10-CM

## 2018-01-18 DIAGNOSIS — I82409 Acute embolism and thrombosis of unspecified deep veins of unspecified lower extremity: Secondary | ICD-10-CM

## 2018-01-18 DIAGNOSIS — I482 Chronic atrial fibrillation, unspecified: Secondary | ICD-10-CM

## 2018-01-18 DIAGNOSIS — J9811 Atelectasis: Secondary | ICD-10-CM

## 2018-01-19 DIAGNOSIS — G8220 Paraplegia, unspecified: Secondary | ICD-10-CM

## 2018-01-19 DIAGNOSIS — I82409 Acute embolism and thrombosis of unspecified deep veins of unspecified lower extremity: Secondary | ICD-10-CM

## 2018-01-19 DIAGNOSIS — J9621 Acute and chronic respiratory failure with hypoxia: Secondary | ICD-10-CM

## 2018-01-19 DIAGNOSIS — J449 Chronic obstructive pulmonary disease, unspecified: Secondary | ICD-10-CM

## 2018-01-19 DIAGNOSIS — I482 Chronic atrial fibrillation, unspecified: Secondary | ICD-10-CM

## 2018-01-19 DIAGNOSIS — J9811 Atelectasis: Secondary | ICD-10-CM

## 2018-01-29 ENCOUNTER — Emergency Department (HOSPITAL_COMMUNITY): Payer: Medicare Other

## 2018-01-29 ENCOUNTER — Inpatient Hospital Stay (HOSPITAL_COMMUNITY): Payer: Medicare Other

## 2018-01-29 ENCOUNTER — Other Ambulatory Visit: Payer: Self-pay

## 2018-01-29 ENCOUNTER — Inpatient Hospital Stay (HOSPITAL_COMMUNITY)
Admission: EM | Admit: 2018-01-29 | Discharge: 2018-02-15 | DRG: 207 | Disposition: E | Payer: Medicare Other | Attending: Pulmonary Disease | Admitting: Pulmonary Disease

## 2018-01-29 DIAGNOSIS — Z930 Tracheostomy status: Secondary | ICD-10-CM

## 2018-01-29 DIAGNOSIS — E1143 Type 2 diabetes mellitus with diabetic autonomic (poly)neuropathy: Secondary | ICD-10-CM | POA: Diagnosis present

## 2018-01-29 DIAGNOSIS — Z9911 Dependence on respirator [ventilator] status: Secondary | ICD-10-CM

## 2018-01-29 DIAGNOSIS — Z6841 Body Mass Index (BMI) 40.0 and over, adult: Secondary | ICD-10-CM

## 2018-01-29 DIAGNOSIS — T17500A Unspecified foreign body in bronchus causing asphyxiation, initial encounter: Secondary | ICD-10-CM

## 2018-01-29 DIAGNOSIS — A419 Sepsis, unspecified organism: Secondary | ICD-10-CM | POA: Diagnosis present

## 2018-01-29 DIAGNOSIS — G931 Anoxic brain damage, not elsewhere classified: Secondary | ICD-10-CM | POA: Diagnosis present

## 2018-01-29 DIAGNOSIS — K3184 Gastroparesis: Secondary | ICD-10-CM | POA: Diagnosis present

## 2018-01-29 DIAGNOSIS — I4901 Ventricular fibrillation: Secondary | ICD-10-CM | POA: Diagnosis present

## 2018-01-29 DIAGNOSIS — Z1624 Resistance to multiple antibiotics: Secondary | ICD-10-CM | POA: Diagnosis present

## 2018-01-29 DIAGNOSIS — G8220 Paraplegia, unspecified: Secondary | ICD-10-CM | POA: Diagnosis present

## 2018-01-29 DIAGNOSIS — J189 Pneumonia, unspecified organism: Secondary | ICD-10-CM | POA: Diagnosis present

## 2018-01-29 DIAGNOSIS — I469 Cardiac arrest, cause unspecified: Secondary | ICD-10-CM | POA: Diagnosis present

## 2018-01-29 DIAGNOSIS — J440 Chronic obstructive pulmonary disease with acute lower respiratory infection: Secondary | ICD-10-CM | POA: Diagnosis present

## 2018-01-29 DIAGNOSIS — L89154 Pressure ulcer of sacral region, stage 4: Secondary | ICD-10-CM | POA: Diagnosis present

## 2018-01-29 DIAGNOSIS — J9601 Acute respiratory failure with hypoxia: Secondary | ICD-10-CM

## 2018-01-29 DIAGNOSIS — D638 Anemia in other chronic diseases classified elsewhere: Secondary | ICD-10-CM | POA: Diagnosis present

## 2018-01-29 DIAGNOSIS — I110 Hypertensive heart disease with heart failure: Secondary | ICD-10-CM | POA: Diagnosis present

## 2018-01-29 DIAGNOSIS — J942 Hemothorax: Secondary | ICD-10-CM

## 2018-01-29 DIAGNOSIS — E870 Hyperosmolality and hypernatremia: Secondary | ICD-10-CM | POA: Diagnosis not present

## 2018-01-29 DIAGNOSIS — I959 Hypotension, unspecified: Secondary | ICD-10-CM

## 2018-01-29 DIAGNOSIS — H5704 Mydriasis: Secondary | ICD-10-CM | POA: Diagnosis present

## 2018-01-29 DIAGNOSIS — Y95 Nosocomial condition: Secondary | ICD-10-CM | POA: Diagnosis present

## 2018-01-29 DIAGNOSIS — J9690 Respiratory failure, unspecified, unspecified whether with hypoxia or hypercapnia: Secondary | ICD-10-CM

## 2018-01-29 DIAGNOSIS — R6521 Severe sepsis with septic shock: Secondary | ICD-10-CM | POA: Diagnosis present

## 2018-01-29 DIAGNOSIS — I2510 Atherosclerotic heart disease of native coronary artery without angina pectoris: Secondary | ICD-10-CM | POA: Diagnosis present

## 2018-01-29 DIAGNOSIS — T17990A Other foreign object in respiratory tract, part unspecified in causing asphyxiation, initial encounter: Secondary | ICD-10-CM | POA: Diagnosis present

## 2018-01-29 DIAGNOSIS — Z8673 Personal history of transient ischemic attack (TIA), and cerebral infarction without residual deficits: Secondary | ICD-10-CM

## 2018-01-29 DIAGNOSIS — Z66 Do not resuscitate: Secondary | ICD-10-CM | POA: Diagnosis not present

## 2018-01-29 DIAGNOSIS — Z515 Encounter for palliative care: Secondary | ICD-10-CM | POA: Diagnosis present

## 2018-01-29 DIAGNOSIS — J9621 Acute and chronic respiratory failure with hypoxia: Secondary | ICD-10-CM

## 2018-01-29 DIAGNOSIS — J9809 Other diseases of bronchus, not elsewhere classified: Secondary | ICD-10-CM

## 2018-01-29 DIAGNOSIS — T17998A Other foreign object in respiratory tract, part unspecified causing other injury, initial encounter: Principal | ICD-10-CM | POA: Diagnosis present

## 2018-01-29 DIAGNOSIS — I5042 Chronic combined systolic (congestive) and diastolic (congestive) heart failure: Secondary | ICD-10-CM | POA: Diagnosis present

## 2018-01-29 DIAGNOSIS — E669 Obesity, unspecified: Secondary | ICD-10-CM | POA: Diagnosis present

## 2018-01-29 DIAGNOSIS — Z430 Encounter for attention to tracheostomy: Secondary | ICD-10-CM

## 2018-01-29 DIAGNOSIS — Z9889 Other specified postprocedural states: Secondary | ICD-10-CM

## 2018-01-29 DIAGNOSIS — J9622 Acute and chronic respiratory failure with hypercapnia: Secondary | ICD-10-CM

## 2018-01-29 DIAGNOSIS — Z8701 Personal history of pneumonia (recurrent): Secondary | ICD-10-CM

## 2018-01-29 DIAGNOSIS — R0602 Shortness of breath: Secondary | ICD-10-CM

## 2018-01-29 DIAGNOSIS — E872 Acidosis: Secondary | ICD-10-CM | POA: Diagnosis present

## 2018-01-29 DIAGNOSIS — X58XXXA Exposure to other specified factors, initial encounter: Secondary | ICD-10-CM | POA: Diagnosis present

## 2018-01-29 DIAGNOSIS — L8990 Pressure ulcer of unspecified site, unspecified stage: Secondary | ICD-10-CM

## 2018-01-29 DIAGNOSIS — Z933 Colostomy status: Secondary | ICD-10-CM

## 2018-01-29 DIAGNOSIS — Z8614 Personal history of Methicillin resistant Staphylococcus aureus infection: Secondary | ICD-10-CM

## 2018-01-29 DIAGNOSIS — S0003XA Contusion of scalp, initial encounter: Secondary | ICD-10-CM | POA: Diagnosis present

## 2018-01-29 LAB — URINALYSIS, MICROSCOPIC (REFLEX)

## 2018-01-29 LAB — LIPASE, BLOOD: Lipase: 26 U/L (ref 11–51)

## 2018-01-29 LAB — CBC WITH DIFFERENTIAL/PLATELET
Abs Immature Granulocytes: 0 10*3/uL (ref 0.00–0.07)
Band Neutrophils: 1 %
Basophils Absolute: 0 10*3/uL (ref 0.0–0.1)
Basophils Relative: 0 %
EOS ABS: 0.8 10*3/uL — AB (ref 0.0–0.5)
Eosinophils Relative: 3 %
HCT: 28.8 % — ABNORMAL LOW (ref 39.0–52.0)
Hemoglobin: 8.4 g/dL — ABNORMAL LOW (ref 13.0–17.0)
LYMPHS ABS: 1.9 10*3/uL (ref 0.7–4.0)
Lymphocytes Relative: 7 %
MCH: 28.8 pg (ref 26.0–34.0)
MCHC: 29.2 g/dL — ABNORMAL LOW (ref 30.0–36.0)
MCV: 98.6 fL (ref 80.0–100.0)
Monocytes Absolute: 0 10*3/uL — ABNORMAL LOW (ref 0.1–1.0)
Monocytes Relative: 0 %
Neutro Abs: 24.3 10*3/uL — ABNORMAL HIGH (ref 1.7–7.7)
Neutrophils Relative %: 89 %
Platelets: 303 10*3/uL (ref 150–400)
RBC: 2.92 MIL/uL — ABNORMAL LOW (ref 4.22–5.81)
RDW: 19.9 % — ABNORMAL HIGH (ref 11.5–15.5)
WBC: 27 10*3/uL — ABNORMAL HIGH (ref 4.0–10.5)
nRBC: 0.1 % (ref 0.0–0.2)
nRBC: 1 /100 WBC — ABNORMAL HIGH

## 2018-01-29 LAB — I-STAT CHEM 8, ED
BUN: 25 mg/dL — ABNORMAL HIGH (ref 8–23)
Calcium, Ion: 1.09 mmol/L — ABNORMAL LOW (ref 1.15–1.40)
Chloride: 96 mmol/L — ABNORMAL LOW (ref 98–111)
Creatinine, Ser: 0.6 mg/dL — ABNORMAL LOW (ref 0.61–1.24)
Glucose, Bld: 293 mg/dL — ABNORMAL HIGH (ref 70–99)
HCT: 26 % — ABNORMAL LOW (ref 39.0–52.0)
Hemoglobin: 8.8 g/dL — ABNORMAL LOW (ref 13.0–17.0)
Potassium: 5.3 mmol/L — ABNORMAL HIGH (ref 3.5–5.1)
Sodium: 136 mmol/L (ref 135–145)
TCO2: 31 mmol/L (ref 22–32)

## 2018-01-29 LAB — I-STAT ARTERIAL BLOOD GAS, ED
Acid-Base Excess: 7 mmol/L — ABNORMAL HIGH (ref 0.0–2.0)
Bicarbonate: 34.8 mmol/L — ABNORMAL HIGH (ref 20.0–28.0)
O2 Saturation: 91 %
PCO2 ART: 70.6 mmHg — AB (ref 32.0–48.0)
Patient temperature: 97.2
TCO2: 37 mmol/L — ABNORMAL HIGH (ref 22–32)
pH, Arterial: 7.297 — ABNORMAL LOW (ref 7.350–7.450)
pO2, Arterial: 66 mmHg — ABNORMAL LOW (ref 83.0–108.0)

## 2018-01-29 LAB — MRSA PCR SCREENING: MRSA BY PCR: NEGATIVE

## 2018-01-29 LAB — POCT I-STAT 3, ART BLOOD GAS (G3+)
ACID-BASE EXCESS: 10 mmol/L — AB (ref 0.0–2.0)
Bicarbonate: 37.6 mmol/L — ABNORMAL HIGH (ref 20.0–28.0)
O2 Saturation: 100 %
TCO2: 40 mmol/L — ABNORMAL HIGH (ref 22–32)
pCO2 arterial: 69.7 mmHg (ref 32.0–48.0)
pH, Arterial: 7.337 — ABNORMAL LOW (ref 7.350–7.450)
pO2, Arterial: 191 mmHg — ABNORMAL HIGH (ref 83.0–108.0)

## 2018-01-29 LAB — COMPREHENSIVE METABOLIC PANEL
ALT: 43 U/L (ref 0–44)
ALT: 47 U/L — ABNORMAL HIGH (ref 0–44)
AST: 58 U/L — ABNORMAL HIGH (ref 15–41)
AST: 41 U/L (ref 15–41)
Albumin: 2 g/dL — ABNORMAL LOW (ref 3.5–5.0)
Albumin: 2.2 g/dL — ABNORMAL LOW (ref 3.5–5.0)
Alkaline Phosphatase: 103 U/L (ref 38–126)
Alkaline Phosphatase: 95 U/L (ref 38–126)
Anion gap: 11 (ref 5–15)
Anion gap: 8 (ref 5–15)
BUN: 21 mg/dL (ref 8–23)
BUN: 25 mg/dL — ABNORMAL HIGH (ref 8–23)
CO2: 30 mmol/L (ref 22–32)
CO2: 31 mmol/L (ref 22–32)
Calcium: 8.1 mg/dL — ABNORMAL LOW (ref 8.9–10.3)
Calcium: 8 mg/dL — ABNORMAL LOW (ref 8.9–10.3)
Chloride: 97 mmol/L — ABNORMAL LOW (ref 98–111)
Chloride: 102 mmol/L (ref 98–111)
Creatinine, Ser: 0.65 mg/dL (ref 0.61–1.24)
Creatinine, Ser: 0.51 mg/dL — ABNORMAL LOW (ref 0.61–1.24)
GFR calc Af Amer: >60 mL/min (ref >60)
GFR calc non Af Amer: >60 mL/min (ref >60)
GFR calc non Af Amer: >60 mL/min (ref >60)
GLUCOSE: 160 mg/dL — AB (ref 70–99)
Glucose, Bld: 291 mg/dL — ABNORMAL HIGH (ref 70–99)
Potassium: 5.5 mmol/L — ABNORMAL HIGH (ref 3.5–5.1)
Potassium: 3.9 mmol/L (ref 3.5–5.1)
Sodium: 138 mmol/L (ref 135–145)
Sodium: 141 mmol/L (ref 135–145)
TOTAL PROTEIN: 5.4 g/dL — AB (ref 6.5–8.1)
Total Bilirubin: 0.6 mg/dL (ref 0.3–1.2)
Total Bilirubin: 0.8 mg/dL (ref 0.3–1.2)
Total Protein: 5.1 g/dL — ABNORMAL LOW (ref 6.5–8.1)

## 2018-01-29 LAB — I-STAT TROPONIN, ED: TROPONIN I, POC: 0.04 ng/mL (ref 0.00–0.08)

## 2018-01-29 LAB — TYPE AND SCREEN
ABO/RH(D): O POS
Antibody Screen: NEGATIVE

## 2018-01-29 LAB — URINALYSIS, ROUTINE W REFLEX MICROSCOPIC
Bilirubin Urine: NEGATIVE
Glucose, UA: 100 mg/dL — AB
Ketones, ur: NEGATIVE mg/dL
Nitrite: NEGATIVE
Protein, ur: >300 mg/dL — AB
Specific Gravity, Urine: 1.015 (ref 1.005–1.030)
pH: 7.5 (ref 5.0–8.0)

## 2018-01-29 LAB — CBC
HCT: 28.3 % — ABNORMAL LOW (ref 39.0–52.0)
Hemoglobin: 8.3 g/dL — ABNORMAL LOW (ref 13.0–17.0)
MCH: 27.5 pg (ref 26.0–34.0)
MCHC: 29.3 g/dL — ABNORMAL LOW (ref 30.0–36.0)
MCV: 93.7 fL (ref 80.0–100.0)
Platelets: 243 10*3/uL (ref 150–400)
RBC: 3.02 MIL/uL — ABNORMAL LOW (ref 4.22–5.81)
RDW: 20 % — ABNORMAL HIGH (ref 11.5–15.5)
WBC: 27.1 10*3/uL — AB (ref 4.0–10.5)
nRBC: 0 % (ref 0.0–0.2)

## 2018-01-29 LAB — CBG MONITORING, ED: GLUCOSE-CAPILLARY: 277 mg/dL — AB (ref 70–99)

## 2018-01-29 LAB — HEMOGLOBIN A1C
Hgb A1c MFr Bld: 6.1 % — ABNORMAL HIGH (ref 4.8–5.6)
Mean Plasma Glucose: 128.37 mg/dL

## 2018-01-29 LAB — TROPONIN I: Troponin I: 0.23 ng/mL (ref <0.03)

## 2018-01-29 LAB — GLUCOSE, CAPILLARY
GLUCOSE-CAPILLARY: 155 mg/dL — AB (ref 70–99)
Glucose-Capillary: 113 mg/dL — ABNORMAL HIGH (ref 70–99)

## 2018-01-29 LAB — LACTIC ACID, PLASMA: Lactic Acid, Venous: 0.9 mmol/L (ref 0.5–1.9)

## 2018-01-29 LAB — PROCALCITONIN: Procalcitonin: 1.69 ng/mL

## 2018-01-29 LAB — PROTIME-INR
INR: 1.23
Prothrombin Time: 15.4 seconds — ABNORMAL HIGH (ref 11.4–15.2)

## 2018-01-29 LAB — I-STAT CG4 LACTIC ACID, ED: Lactic Acid, Venous: 6.25 mmol/L (ref 0.5–1.9)

## 2018-01-29 LAB — BRAIN NATRIURETIC PEPTIDE: B Natriuretic Peptide: 237.3 pg/mL — ABNORMAL HIGH (ref 0.0–100.0)

## 2018-01-29 LAB — ABO/RH: ABO/RH(D): O POS

## 2018-01-29 MED ORDER — CALCIUM GLUCONATE-NACL 1-0.675 GM/50ML-% IV SOLN
1.0000 g | Freq: Once | INTRAVENOUS | Status: AC
  Administered 2018-01-29: 1000 mg via INTRAVENOUS
  Filled 2018-01-29: qty 50

## 2018-01-29 MED ORDER — SODIUM CHLORIDE 0.9 % IV BOLUS
2000.0000 mL | Freq: Once | INTRAVENOUS | Status: AC
  Administered 2018-01-29: 2000 mL via INTRAVENOUS

## 2018-01-29 MED ORDER — FENTANYL CITRATE (PF) 100 MCG/2ML IJ SOLN
100.0000 ug | Freq: Once | INTRAMUSCULAR | Status: AC
  Administered 2018-01-29: 100 ug via INTRAVENOUS
  Filled 2018-01-29: qty 2

## 2018-01-29 MED ORDER — PANTOPRAZOLE SODIUM 40 MG IV SOLR
40.0000 mg | Freq: Every day | INTRAVENOUS | Status: DC
  Administered 2018-01-29 – 2018-02-06 (×9): 40 mg via INTRAVENOUS
  Filled 2018-01-29 (×10): qty 40

## 2018-01-29 MED ORDER — INSULIN ASPART 100 UNIT/ML ~~LOC~~ SOLN
0.0000 [IU] | SUBCUTANEOUS | Status: DC
  Administered 2018-01-29: 3 [IU] via SUBCUTANEOUS
  Administered 2018-01-30 (×2): 2 [IU] via SUBCUTANEOUS
  Administered 2018-01-31: 3 [IU] via SUBCUTANEOUS
  Administered 2018-01-31: 2 [IU] via SUBCUTANEOUS
  Administered 2018-01-31: 3 [IU] via SUBCUTANEOUS
  Administered 2018-01-31 – 2018-02-01 (×3): 2 [IU] via SUBCUTANEOUS
  Administered 2018-02-01 (×2): 3 [IU] via SUBCUTANEOUS
  Administered 2018-02-01: 2 [IU] via SUBCUTANEOUS
  Administered 2018-02-01: 3 [IU] via SUBCUTANEOUS
  Administered 2018-02-01 – 2018-02-02 (×3): 2 [IU] via SUBCUTANEOUS
  Administered 2018-02-02: 3 [IU] via SUBCUTANEOUS
  Administered 2018-02-02 – 2018-02-03 (×3): 2 [IU] via SUBCUTANEOUS
  Administered 2018-02-03 – 2018-02-04 (×4): 3 [IU] via SUBCUTANEOUS
  Administered 2018-02-04 (×3): 2 [IU] via SUBCUTANEOUS
  Administered 2018-02-05: 3 [IU] via SUBCUTANEOUS
  Administered 2018-02-05 – 2018-02-06 (×4): 5 [IU] via SUBCUTANEOUS
  Administered 2018-02-06: 3 [IU] via SUBCUTANEOUS
  Administered 2018-02-06: 5 [IU] via SUBCUTANEOUS
  Administered 2018-02-06: 3 [IU] via SUBCUTANEOUS
  Administered 2018-02-06: 5 [IU] via SUBCUTANEOUS
  Administered 2018-02-07 (×2): 3 [IU] via SUBCUTANEOUS
  Administered 2018-02-07: 2 [IU] via SUBCUTANEOUS
  Administered 2018-02-07: 3 [IU] via SUBCUTANEOUS

## 2018-01-29 MED ORDER — CHLORHEXIDINE GLUCONATE 0.12% ORAL RINSE (MEDLINE KIT)
15.0000 mL | Freq: Two times a day (BID) | OROMUCOSAL | Status: DC
  Administered 2018-01-29 – 2018-02-07 (×16): 15 mL via OROMUCOSAL

## 2018-01-29 MED ORDER — LACTATED RINGERS IV BOLUS: 2000.0000 mL | Freq: Once | INTRAVENOUS | Status: DC

## 2018-01-29 MED ORDER — SODIUM CHLORIDE 0.9 % IV SOLN
250.0000 mL | INTRAVENOUS | Status: DC
  Administered 2018-02-09: 500 mL via INTRAVENOUS

## 2018-01-29 MED ORDER — SODIUM CHLORIDE 0.9 % IV SOLN
2.0000 g | Freq: Three times a day (TID) | INTRAVENOUS | Status: DC
  Administered 2018-01-30 – 2018-02-02 (×11): 2 g via INTRAVENOUS
  Filled 2018-01-29 (×13): qty 2

## 2018-01-29 MED ORDER — NOREPINEPHRINE-SODIUM CHLORIDE 4-0.9 MG/250ML-% IV SOLN
0.0000 ug/min | INTRAVENOUS | Status: DC
  Administered 2018-01-29: 7.5 ug/min via INTRAVENOUS
  Administered 2018-01-30 (×2): 5 ug/min via INTRAVENOUS
  Administered 2018-02-02: 1.5 ug/min via INTRAVENOUS
  Administered 2018-02-03: 4 ug/min via INTRAVENOUS
  Filled 2018-01-29 (×4): qty 250

## 2018-01-29 MED ORDER — FENTANYL CITRATE (PF) 100 MCG/2ML IJ SOLN
100.0000 ug | INTRAMUSCULAR | Status: DC | PRN
  Administered 2018-01-30 – 2018-02-07 (×22): 100 ug via INTRAVENOUS
  Filled 2018-01-29 (×23): qty 2

## 2018-01-29 MED ORDER — FENTANYL CITRATE (PF) 100 MCG/2ML IJ SOLN
INTRAMUSCULAR | Status: AC
  Administered 2018-01-29: 100 ug via INTRAVENOUS
  Filled 2018-01-29: qty 2

## 2018-01-29 MED ORDER — MIDAZOLAM HCL 2 MG/2ML IJ SOLN
2.0000 mg | Freq: Once | INTRAMUSCULAR | Status: AC
  Administered 2018-01-29: 2 mg via INTRAVENOUS
  Filled 2018-01-29: qty 2

## 2018-01-29 MED ORDER — SODIUM CHLORIDE 0.9% FLUSH
3.0000 mL | Freq: Once | INTRAVENOUS | Status: AC
  Administered 2018-01-29: 3 mL via INTRAVENOUS

## 2018-01-29 MED ORDER — VANCOMYCIN HCL IN DEXTROSE 1-5 GM/200ML-% IV SOLN
1000.0000 mg | Freq: Once | INTRAVENOUS | Status: DC
  Filled 2018-01-29: qty 200

## 2018-01-29 MED ORDER — VANCOMYCIN HCL 10 G IV SOLR
2500.0000 mg | Freq: Once | INTRAVENOUS | Status: AC
  Administered 2018-01-29: 2500 mg via INTRAVENOUS
  Filled 2018-01-29: qty 2500

## 2018-01-29 MED ORDER — ORAL CARE MOUTH RINSE
15.0000 mL | OROMUCOSAL | Status: DC
  Administered 2018-01-30 – 2018-02-08 (×71): 15 mL via OROMUCOSAL

## 2018-01-29 MED ORDER — HEPARIN SODIUM (PORCINE) 5000 UNIT/ML IJ SOLN: 5000.0000 [IU] | Freq: Three times a day (TID) | INTRAMUSCULAR | Status: DC

## 2018-01-29 MED ORDER — SODIUM CHLORIDE 0.9 % IV SOLN
2.0000 g | Freq: Once | INTRAVENOUS | Status: AC
  Administered 2018-01-29: 2 g via INTRAVENOUS
  Filled 2018-01-29: qty 2

## 2018-01-29 MED ORDER — FENTANYL CITRATE (PF) 100 MCG/2ML IJ SOLN
100.0000 ug | Freq: Once | INTRAMUSCULAR | Status: AC
  Administered 2018-01-29: 100 ug via INTRAVENOUS

## 2018-01-29 MED ORDER — METRONIDAZOLE IN NACL 5-0.79 MG/ML-% IV SOLN
500.0000 mg | Freq: Three times a day (TID) | INTRAVENOUS | Status: DC
  Administered 2018-01-29 – 2018-01-30 (×3): 500 mg via INTRAVENOUS
  Filled 2018-01-29 (×3): qty 100

## 2018-01-29 MED ORDER — VANCOMYCIN HCL 10 G IV SOLR
1250.0000 mg | Freq: Two times a day (BID) | INTRAVENOUS | Status: DC
  Administered 2018-01-30 – 2018-02-02 (×7): 1250 mg via INTRAVENOUS
  Filled 2018-01-29 (×9): qty 1250

## 2018-01-29 MED FILL — Medication: Qty: 1 | Status: AC

## 2018-01-29 NOTE — ED Notes (Signed)
CCM at bedside 

## 2018-01-29 NOTE — ED Notes (Signed)
Pt's family taken to consultation B. Staff notified.

## 2018-01-29 NOTE — Progress Notes (Signed)
Transported pt from ED Tra A to CT 1 on vent. Pt stable throughout with no complications. VS within normal limits

## 2018-01-29 NOTE — Progress Notes (Signed)
Pt transported from ct to 3M12 on vent. Pt stable throughout with no complications. VS within normal limits

## 2018-01-29 NOTE — H&P (Signed)
NAME:  Malik Brooks, MRN:  [id number], DOB:  06/01/1955, LOS: 0 ADMISSION DATE:  01/25/2018, CONSULTATION DATE:  02/11/2018 REFERRING MD: Clarene DukeLittle - EM  , CHIEF COMPLAINT:  Post-arrest  Brief History   63 year old male who [facility] from [facility] post-arrest. Estimated code time 31 minutes, receiving 8x epi and defibrillation x3. ROSC achieved.   History of present illness   63 year old male with PMH significant for 5x cardiac arrest, spinal cord infarct with resultant paraplegia, vent dependency with trach, colostomy and g-tube, DVT, COPD, DM, HTN, CAD, atrial fibrillation who [facility]. The patient became bradycardic and eventually arrested. Total down time estimated to be 31 minutes. 8x epinephrine given, 3x defibrillation given, amiodarone and atropine given. ROSC achieved.  In ED, started on vanc, zosyn, cefepime. Pan cultured. STAT Head CT ordered.    Past Medical History  Cardiac Arrest  Spinal cord infarct Paraplegia  DVT DM HTN CAD COPD Ventilator dependency S/p trach, colostomy, g-tube placement     Significant Hospital Events    1/15> Admitted following cardiopulmonary arrest with ROSC   Consults:  PCCM Procedures:    Significant Diagnostic Tests:  Head CT 1/15>>>  Micro Data:  UCx 1/15>>> BCx 1/15>>> Tracheal aspirate 1/15>>>  Antimicrobials:  Vanc 1/15>> Cefepime 1/15>> Flagyl>>   Interim history/subjective:  Presents post arrest. Mechanically ventilated with vasoactive medication requirement.  Family updated on events, advised of pending CT imaging. In conversation with attending, patient code status changed to DNR.   Objective   Blood pressure 115/76, pulse (!) 104, temperature 97.7 F (36.5 C), resp. rate (!) 33, height 5\' 5"  (1.651 m), weight 125.3 kg, SpO2 99 %.    Vent Mode: PRVC FiO2 (%):  [100 %] 100 % Set Rate:  [22 bmp] 22 bmp Vt Set:  [490 mL-580 mL] 490 mL PEEP:  [5 cmH20-8 cmH20] 5 cmH20    Intake/Output Summary (Last 24 hours) at 01/22/2018 1722 Last data filed at 02/07/2018 1635 Gross per 24 hour  Intake 100 ml  Output -  Net 100 ml   Filed Weights   02/07/2018 1600  Weight: 125.3 kg    Examination: General: obese adult male, trached and on MV HENT: [place]/AT. Trach secure. Pupils 5mm. Tacky mucus membranes Lungs: Bilateral rhonchi. Symmetrical chest expansion  Cardiovascular: no r/g/m. 2+ radial pulses Capillary refill < 3 seconds BUE BLE Abdomen: Round, soft, non-distended. Hypoactive x4. Colostomy, J-tube.  Extremities: No obvious joint deformity  Neuro: Decerebrate posturing. Pupils 5mm, upward and roving gaze.  GU: foley Skin: scattered ecchymosis. Warm, dry.   Resolved Hospital Problem list     Assessment & Plan:  Cardiac Arrest - Initially V.fib then PEA with prolonged downtime of ~30 minutes.   - Admit to ICU. - Continue supportive care / measures. - Levophed if needed to maintain goal MAP > 65. - Defer cooling given prolonged downtime with poor baseline functional status. - DNR in the event of recurrent arrest. - Trend troponin / lactate. - Assess echo.  Respiratory insufficiency - s/p trach in 2018 due to paraplegic status following spinal cord infarct. - Continue full vent support. - No weaning unless mental status allows. - Bronchial hygiene. - Follow CXR / ABG.  Concern for anoxic injury given prolonged downtime. - F/u on CT head. - Follow neuro exam over next 24 - 48 hours. - May need neuro input.  ? Underlying infection. -Was reportedly being evaluated for UTI at [facility] prior to  cardiopulmonary arrest - Continue empiric abx for now. -Narrow as able/appropriate - Follow cultures.  Hypocalcemia. - 1g Ca gluconate.  Anemia - chronic (baseline ~ 8). - Transfuse for Hgb < 7.  Hx DM. - SSI.  Best practice:  Diet: NPO Pain/Anxiety/Delirium protocol (if indicated): Fentanyl PRN VAP protocol (if indicated): Yes DVT  prophylaxis: SCDs GI prophylaxis: protonix Glucose control: SSI Mobility: bedrest Code Status: DNR Family Communication: Family updated in ED  Disposition: admit to ICU   Labs   CBC: Recent Labs  Lab 02/14/2018 1533 02/14/2018 1549  WBC 27.0*  --   NEUTROABS 24.3*  --   HGB 8.4* 8.8*  HCT 28.8* 26.0*  MCV 98.6  --   PLT 303  --     Basic Metabolic Panel: Recent Labs  Lab 02/13/2018 1533 01/24/2018 1549  NA 138 136  K 5.5* 5.3*  CL 97* 96*  CO2 30  --   GLUCOSE 291* 293*  BUN 21 25*  CREATININE 0.65 0.60*  CALCIUM 8.1*  --    GFR: Estimated Creatinine Clearance: 114.8 mL/min (A) (by C-G formula based on SCr of 0.6 mg/dL (L)). Recent Labs  Lab 01/27/2018 1533 02/05/2018 1543  WBC 27.0*  --   LATICACIDVEN  --  6.25*    Liver Function Tests: Recent Labs  Lab 02/01/2018 1533  AST 58*  ALT 43  ALKPHOS 103  BILITOT 0.6  PROT 5.1*  ALBUMIN 2.0*   Recent Labs  Lab 02/01/2018 1533  LIPASE 26   No results for input(s): AMMONIA in the last 168 hours.  ABG    Component Value Date/Time   PHART 7.297 (L) 01/28/2018 1548   PCO2ART 70.6 (HH) 02/06/2018 1548   PO2ART 66.0 (L) 01/22/2018 1548   HCO3 34.8 (H) 01/25/2018 1548   TCO2 31 02/06/2018 1549   O2SAT 91.0 01/15/2018 1548     Coagulation Profile: Recent Labs  Lab 02/09/2018 1533  INR 1.23    Cardiac Enzymes: No results for input(s): CKTOTAL, CKMB, CKMBINDEX, TROPONINI in the last 168 hours.  HbA1C: No results found for: HGBA1C  CBG: Recent Labs  Lab 01/19/2018 1520  GLUCAP 277*    Review of Systems:   Obtained by family.  Denies recent chest pain Endorses thick secretions Endorses malaise, feeling unwell Endorses recent cutaneous wounds   Past Medical History  He,  has no past medical history on file.   Surgical History   Not on file, Patient chart to be merged    Social History      Family History   His family history is not on file.   Allergies Allergies not on file   Home  Medications  Prior to Admission medications   Not on File     Critical care time: 60 minutes     Tessie FassGrace Mayfield Schoene MSN, AGACNP-BC [facility] Pulmonary/Critical Care Medicine 02/02/2018, 6:13 PM

## 2018-01-29 NOTE — Progress Notes (Signed)
eLink Physician-Brief Progress Note Patient Name: Malik Brooks DOB: 10/19/1955 MRN: [id number]   Date of Service  Feb 11, 2018  HPI/Events of Note  Modest Troponin bump s/p CPR for cardiopulmonary arrest  eICU Interventions  Continue to cycle enzymes, no intervention indicated         Migdalia Dk 02-11-2018, 10:19 PM

## 2018-01-29 NOTE — ED Notes (Signed)
Temp foley temp.  36.5

## 2018-01-29 NOTE — Progress Notes (Signed)
eLink Physician-Brief Progress Note Patient Name: Malik Brooks DOB: February 16, 1955 MRN: [id number]   Date of Service  02/13/2018  HPI/Events of Note  ABG shows respiratory acidosis on the ventilator. PO2 194 on FiO2 of .70. Pt has a hx of chronic respiratory failure due to COPD  eICU Interventions  Respiratory rate increased to 26 and FiO2 reduced to .50        Malik Brooks 01/23/2018, 8:56 PM

## 2018-01-29 NOTE — ED Notes (Signed)
MD Little aware of hypotension and increasing pressor requirement.

## 2018-01-29 NOTE — ED Triage Notes (Addendum)
Patient arrived from [facility] hospital following cardiac arrest. Per EMS patient had witnessed arrest and received 3 shocks for fib. On EMS arrival patient had no pulse and CPR ongoing. Vent dependent and has had 4 previous arrest. Received the following prior to EMS arrival: Epi x 8 Amiodarone  300 Defib x 3 Patient arrived with PICC and IO CPR 35 minutes ROSC  1450 Patient arrived with existing foley catheter CBG on arrival 277

## 2018-01-29 NOTE — ED Provider Notes (Addendum)
[facility] EMERGENCY DEPARTMENT Provider Note   CSN: [id number] Arrival date & time: Feb 23, 2018  1519     History   Chief Complaint No chief complaint on file.   HPI Malik Brooks is a 63 y.o. male.  63yo M w/ unknown PMH but including trach and vent dependence, g-tube, colostomy who p/w cardiac arrest. EMS reports that the patient was transferred to a new unit at [facility] today and they were just getting him checked in when he became bradycardic and eventually arrested. CPR initiated and a total of 8 rounds of epi given. Shock advised on scene by AED and he had a total of 3 defibrillations. When EMS arrived, he was in PEA arrest, continued epi and gave amiodarone and atropine. ROSC achieved PTA.   The history is provided by the EMS personnel.    No past medical history on file.  There are no active problems to display for this patient.   ** The histories are not reviewed yet. Please review them in the "History" navigator section and refresh this SmartLink.   LEVEL 5 CAVEAT DUE TO INTUBATION   Home Medications    Prior to Admission medications   Not on File  UNABLE TO OBTAIN  Family History No family history on file. UNABLE TO OBTAIN Social History Social History   Tobacco Use  . Smoking status: Not on file  Substance Use Topics  . Alcohol use: Not on file  . Drug use: Not on file   UNABLE TO OBTAIN  Allergies   Patient has no allergy information on record. UNABLE TO OBTAIN  Review of Systems Review of Systems  Unable to perform ROS: Intubated     Physical Exam Updated Vital Signs BP (!) 60/40 (BP Location: Right Arm)   Pulse (!) 114 Comment: levo adjusted. hypotension s/p versed/fent admin. MD aware  Temp 97.7 F (36.5 C) Comment: levo adjusted. hypotension s/p versed/fent admin. MD aware  Resp (!) 32 Comment: levo adjusted. hypotension s/p versed/fent admin. MD aware  Ht 5\' 5"  (1.651 m)   Wt 125.3 kg   SpO2 97% Comment: levo  adjusted. hypotension s/p versed/fent admin. MD aware  BMI 45.97 kg/m   Physical Exam Vitals signs and nursing note reviewed.  Constitutional:      General: He is not in acute distress.    Appearance: He is well-developed.     Comments: Intubated, chronically ill appearing  HENT:     Head: Normocephalic and atraumatic.  Eyes:     Conjunctiva/sclera: Conjunctivae normal.  Neck:     Musculoskeletal: Neck supple.  Cardiovascular:     Rate and Rhythm: Normal rate and regular rhythm.     Heart sounds: Normal heart sounds. No murmur.     Comments: Distant heart sounds Pulmonary:     Comments: On vent, trach in place without drainage; difficult to auscultate lung sounds 2/2 body habitus Abdominal:     Palpations: Abdomen is soft.     Comments: Colostomy bag leaking stool  Genitourinary:    Comments: Foley catheter in place draining cloudy urine Musculoskeletal:     Right lower leg: Edema present.     Left lower leg: Edema present.  Skin:    General: Skin is warm and dry.  Neurological:     Comments: GCS 3T, not withdrawing to pain      ED Treatments / Results  Labs (all labs ordered are listed, but only abnormal results are displayed) Labs Reviewed  CBC WITH DIFFERENTIAL/PLATELET -  Abnormal; Notable for the following components:      Result Value   WBC 27.0 (*)    RBC 2.92 (*)    Hemoglobin 8.4 (*)    HCT 28.8 (*)    MCHC 29.2 (*)    RDW 19.9 (*)    Neutro Abs 24.3 (*)    Monocytes Absolute 0.0 (*)    Eosinophils Absolute 0.8 (*)    nRBC 1 (*)    All other components within normal limits  PROTIME-INR - Abnormal; Notable for the following components:   Prothrombin Time 15.4 (*)    All other components within normal limits  I-STAT CG4 LACTIC ACID, ED - Abnormal; Notable for the following components:   Lactic Acid, Venous 6.25 (*)    All other components within normal limits  I-STAT ARTERIAL BLOOD GAS, ED - Abnormal; Notable for the following components:   pH,  Arterial 7.297 (*)    pCO2 arterial 70.6 (*)    pO2, Arterial 66.0 (*)    Bicarbonate 34.8 (*)    TCO2 37 (*)    Acid-Base Excess 7.0 (*)    All other components within normal limits  I-STAT CHEM 8, ED - Abnormal; Notable for the following components:   Potassium 5.3 (*)    Chloride 96 (*)    BUN 25 (*)    Creatinine, Ser 0.60 (*)    Glucose, Bld 293 (*)    Calcium, Ion 1.09 (*)    Hemoglobin 8.8 (*)    HCT 26.0 (*)    All other components within normal limits  CBG MONITORING, ED - Abnormal; Notable for the following components:   Glucose-Capillary 277 (*)    All other components within normal limits  CULTURE, BLOOD (ROUTINE X 2)  CULTURE, BLOOD (ROUTINE X 2)  URINE CULTURE  COMPREHENSIVE METABOLIC PANEL  URINALYSIS, ROUTINE W REFLEX MICROSCOPIC  LIPASE, BLOOD  I-STAT TROPONIN, ED  TYPE AND SCREEN    EKG EKG Interpretation  Date/Time:  Wednesday January 29 2018 15:29:20 EST Ventricular Rate:  84 PR Interval:    QRS Duration: 147 QT Interval:  440 QTC Calculation: 539 R Axis:   -56 Text Interpretation:  Atrial fibrillation Ventricular premature complex Right bundle branch block No previous ECGs available Confirmed by Frederick PeersLittle, Debbora Ang [phone]([id number]) on 01/20/2018 4:20:12 PM   Radiology No results found.  Procedures A limited Emergency Department Echocardiogram was performed  due to cardiac arrest. The pericardial sac, myocardium, 4 chambers, and IVC were identified using the following views: subxiphoid, parasternal long-axis and parasternal short-axis. The exam revealed small pericardial effusion (small to moderate). The exam was limited by body habitus.  Cardiac activity: hyperdynamic    RV Diameter: normal     Impressions: small pericardial effusion  The exam was performed by the attending. .Critical Care Performed by: Laurence SpatesLittle, Pierce Barocio Morgan, MD Authorized by: Laurence SpatesLittle, Stanislaus Kaltenbach Morgan, MD   Critical care provider statement:    Critical care time (minutes):  40    Critical care time was exclusive of:  Separately billable procedures and treating other patients   Critical care was necessary to treat or prevent imminent or life-threatening deterioration of the following conditions:  Cardiac failure and shock   Critical care was time spent personally by me on the following activities:  Development of treatment plan with patient or surrogate, discussions with consultants, evaluation of patient's response to treatment, examination of patient, ordering and performing treatments and interventions, ordering and review of laboratory studies, ordering and review of radiographic studies, obtaining  history from patient or surrogate and re-evaluation of patient's condition   (including critical care time) EMERGENCY DEPARTMENT US LUNG EXAM "Study: Limited Ultrasound of the Lung and Thorax"  INDICATIONS: Chest pain and Abnormal vitals Multiple views of both lungs using sagittal orientation were obtained.  PERFORMED BY: Myself IMAGES ARCHIVED?: Yes LIMITATIONS: Body habitus VIEWS USED: Anterior lung fields INTERPRETATION: No pneumothorax      Medications Ordered in ED Medications  norepinephrine (LEVOPHED) 4mg  in NS 250mL premix infusion (10 mcg/min Intravenous Rate/Dose Change 02/02/2018 1625)  ceFEPIme (MAXIPIME) 2 g in sodium chloride 0.9 % 100 mL IVPB (2 g Intravenous New Bag/Given 01/17/2018 1605)  metroNIDAZOLE (FLAGYL) IVPB 500 mg (500 mg Intravenous New Bag/Given 01/20/2018 1606)  vancomycin (VANCOCIN) 2,500 mg in sodium chloride 0.9 % 500 mL IVPB (has no administration in time range)  sodium chloride 0.9 % bolus 2,000 mL (2,000 mLs Intravenous New Bag/Given 01/22/2018 1619)  vancomycin (VANCOCIN) 1,250 mg in sodium chloride 0.9 % 250 mL IVPB (has no administration in time range)  ceFEPIme (MAXIPIME) 2 g in sodium chloride 0.9 % 100 mL IVPB (has no administration in time range)  sodium chloride flush (NS) 0.9 % injection 3 mL (3 mLs Intravenous Given 02/02/2018 1618)    fentaNYL (SUBLIMAZE) injection 100 mcg (100 mcg Intravenous Given 02/12/2018 1618)  midazolam (VERSED) injection 2 mg (2 mg Intravenous Given 01/26/2018 1618)     Initial Impression / Assessment and Plan / ED Course  I have reviewed the triage vital signs and the nursing notes.  Pertinent labs & imaging results that were available during my care of the patient were reviewed by me and considered in my medical decision making (see chart for details).    On arrival, he was on vent, femoral pulses present, O2 sat 91%, hypotensive. Later found to be hypothermic. Initiated code sepsis w/ cultures, IVF bolus, levophed, and Vanc/cefepime. EKG without ischemic changes. Bedside US shows no pneumothorax, small to moderate pericardial effusion but good heart squeeze. ABG pH 7.3, CO2 70, potassium 5.3, glucose 293, normal creatinine, negative troponin, lactate 6.  WBC 27,000.  I have ordered UA and exchange of Foley catheter.  UA suggests infection.    Of note, I ordered only 2L IVF and this is less than 3130ml/kg as required for severe sepsis. I feel that his lactate is partially due to his arrest and he has pulmonary edema on CXR with difficulty maintaining O2 sat on 100% FiO2 on vent. Will hold on further IV fluids for now and defer to primary MICU team.   Discussed with critical care and patient will be admitted to MICU. Gave fentanyl/versed and soft restraints for some upper extremity movements interfering with medical care.  Final Clinical Impressions(s) / ED Diagnoses   Final diagnoses:  Cardiac arrest (HCC)  Hypotension, unspecified hypotension type    ED Discharge Orders    None       Jenkins Risdon, Ambrose Finlandachel Morgan, MD 01/21/2018 1647    Istvan Behar, Ambrose Finlandachel Morgan, MD 01/23/2018 1807

## 2018-01-29 NOTE — Progress Notes (Signed)
CRITICAL VALUE ALERT  Critical Value:  Troponin 0.23  Date & Time Notied: 01/21/2018 2148  Provider Notified:E-link MD  Orders Received/Actions taken:awaiting orders

## 2018-01-29 NOTE — Progress Notes (Signed)
Pharmacy Antibiotic Note  Malik Brooks is a 63 y.o. male admitted from [facility] on 2018/02/01 s/p cardiac arrest.  Pharmacy has been consulted for vancomycin and cefepime dosing for sepsis.  He is also on Flagyl.  Noted patient is paraplegic.  SCr 0.6, CrCL > 100 ml/min, afebrile, WBC 27, LA 6.25.   Plan: Vanc 2500mg  IV x 1, then 1250mg  IV Q12H for AUC 503 using SCr 0.8 Cefepime 2gm IV Q8H Flagyl 500mg  IV Q8H per MD Monitor renal fxn, clinical progress, vanc AUC at Css given paraplegia and BMI   Height: 5\' 8"  (172.7 cm) Weight: 276 lb 3.8 oz (125.3 kg) IBW/kg (Calculated) : 68.4  Temp (24hrs), Avg:93.4 F (34.1 C), Min:89.6 F (32 C), Max:97.2 F (36.2 C)  Recent Labs  Lab 01-Feb-2018 1533 February 01, 2018 1543 Feb 01, 2018 1549  WBC 27.0*  --   --   CREATININE  --   --  0.60*  LATICACIDVEN  --  6.25*  --     Estimated Creatinine Clearance: 120.3 mL/min (A) (by C-G formula based on SCr of 0.6 mg/dL (L)).    Allergies not on file   Vanc 1/15 >> Cefepime 1/15 >> Flagyl 1/15 >>  1/15 UCx -  1/15 BCx -    [name] D. Laney Potash, PharmD, BCPS, BCCCP 2018/02/01, 4:18 PM

## 2018-01-30 ENCOUNTER — Inpatient Hospital Stay (HOSPITAL_COMMUNITY): Payer: Medicare Other

## 2018-01-30 DIAGNOSIS — J9622 Acute and chronic respiratory failure with hypercapnia: Secondary | ICD-10-CM | POA: Diagnosis not present

## 2018-01-30 DIAGNOSIS — J9621 Acute and chronic respiratory failure with hypoxia: Secondary | ICD-10-CM | POA: Diagnosis not present

## 2018-01-30 DIAGNOSIS — L8990 Pressure ulcer of unspecified site, unspecified stage: Secondary | ICD-10-CM

## 2018-01-30 LAB — BASIC METABOLIC PANEL
Anion gap: 10 (ref 5–15)
BUN: 24 mg/dL — ABNORMAL HIGH (ref 8–23)
CO2: 28 mmol/L (ref 22–32)
Calcium: 8.1 mg/dL — ABNORMAL LOW (ref 8.9–10.3)
Chloride: 101 mmol/L (ref 98–111)
Creatinine, Ser: 0.45 mg/dL — ABNORMAL LOW (ref 0.61–1.24)
GFR calc Af Amer: >60 mL/min (ref >60)
GFR calc non Af Amer: >60 mL/min (ref >60)
Glucose, Bld: 166 mg/dL — ABNORMAL HIGH (ref 70–99)
Potassium: 3.8 mmol/L (ref 3.5–5.1)
Sodium: 139 mmol/L (ref 135–145)

## 2018-01-30 LAB — POCT I-STAT 3, ART BLOOD GAS (G3+)
Acid-Base Excess: 10 mmol/L — ABNORMAL HIGH (ref 0.0–2.0)
BICARBONATE: 35 mmol/L — AB (ref 20.0–28.0)
O2 Saturation: 93 %
PCO2 ART: 50.9 mmHg — AB (ref 32.0–48.0)
Patient temperature: 98.9
TCO2: 37 mmol/L — ABNORMAL HIGH (ref 22–32)
pH, Arterial: 7.446 (ref 7.350–7.450)
pO2, Arterial: 66 mmHg — ABNORMAL LOW (ref 83.0–108.0)

## 2018-01-30 LAB — CBC
HCT: 26.2 % — ABNORMAL LOW (ref 39.0–52.0)
HEMOGLOBIN: 7.8 g/dL — AB (ref 13.0–17.0)
MCH: 28.2 pg (ref 26.0–34.0)
MCHC: 29.8 g/dL — ABNORMAL LOW (ref 30.0–36.0)
MCV: 94.6 fL (ref 80.0–100.0)
Platelets: 240 10*3/uL (ref 150–400)
RBC: 2.77 MIL/uL — AB (ref 4.22–5.81)
RDW: 19.9 % — ABNORMAL HIGH (ref 11.5–15.5)
WBC: 16.3 10*3/uL — ABNORMAL HIGH (ref 4.0–10.5)
nRBC: 0 % (ref 0.0–0.2)

## 2018-01-30 LAB — GLUCOSE, CAPILLARY
GLUCOSE-CAPILLARY: 103 mg/dL — AB (ref 70–99)
Glucose-Capillary: 96 mg/dL (ref 70–99)
Glucose-Capillary: 106 mg/dL — ABNORMAL HIGH (ref 70–99)
Glucose-Capillary: 126 mg/dL — ABNORMAL HIGH (ref 70–99)
Glucose-Capillary: 124 mg/dL — ABNORMAL HIGH (ref 70–99)

## 2018-01-30 LAB — PHOSPHORUS
Phosphorus: 2.5 mg/dL (ref 2.5–4.6)
Phosphorus: 2.5 mg/dL (ref 2.5–4.6)

## 2018-01-30 LAB — HIV ANTIBODY (ROUTINE TESTING W REFLEX): HIV Screen 4th Generation wRfx: NONREACTIVE

## 2018-01-30 LAB — MAGNESIUM
MAGNESIUM: 1.9 mg/dL (ref 1.7–2.4)
Magnesium: 1.9 mg/dL (ref 1.7–2.4)

## 2018-01-30 LAB — PROCALCITONIN: Procalcitonin: 3.83 ng/mL

## 2018-01-30 LAB — TROPONIN I: Troponin I: 0.16 ng/mL (ref <0.03)

## 2018-01-30 MED ORDER — ACETYLCYSTEINE 10 % IN SOLN
4.0000 mL | Freq: Four times a day (QID) | RESPIRATORY_TRACT | Status: DC
  Filled 2018-01-30 (×2): qty 4

## 2018-01-30 MED ORDER — PRO-STAT SUGAR FREE PO LIQD
30.0000 mL | Freq: Two times a day (BID) | ORAL | Status: DC
  Administered 2018-01-30: 30 mL
  Filled 2018-01-30: qty 30

## 2018-01-30 MED ORDER — ACETYLCYSTEINE 20 % IN SOLN
4.0000 mL | Freq: Four times a day (QID) | RESPIRATORY_TRACT | Status: DC
  Administered 2018-01-30: 4 mL via RESPIRATORY_TRACT
  Filled 2018-01-30: qty 4

## 2018-01-30 MED ORDER — ACETYLCYSTEINE 10 % IN SOLN
4.0000 mL | RESPIRATORY_TRACT | Status: DC
  Filled 2018-01-30 (×3): qty 4

## 2018-01-30 MED ORDER — VITAL HIGH PROTEIN PO LIQD
1000.0000 mL | ORAL | Status: DC
  Administered 2018-01-30 – 2018-01-31 (×2): 1000 mL

## 2018-01-30 MED ORDER — ACETAMINOPHEN 160 MG/5ML PO SOLN: 1000.0000 mg | Freq: Four times a day (QID) | ORAL | Status: DC | PRN

## 2018-01-30 MED ORDER — IPRATROPIUM-ALBUTEROL 0.5-2.5 (3) MG/3ML IN SOLN
3.0000 mL | Freq: Four times a day (QID) | RESPIRATORY_TRACT | Status: DC
  Administered 2018-01-30 – 2018-02-05 (×24): 3 mL via RESPIRATORY_TRACT
  Filled 2018-01-30 (×24): qty 3

## 2018-01-30 MED ORDER — VITAL HIGH PROTEIN PO LIQD
1000.0000 mL | ORAL | Status: DC
  Administered 2018-01-30: 1000 mL

## 2018-01-30 MED ORDER — ACETYLCYSTEINE 20 % IN SOLN
4.0000 mL | Freq: Four times a day (QID) | RESPIRATORY_TRACT | Status: AC
  Administered 2018-01-30 – 2018-02-02 (×13): 4 mL via RESPIRATORY_TRACT
  Filled 2018-01-30 (×12): qty 4

## 2018-01-30 MED ORDER — GUAIFENESIN 200 MG PO TABS
300.0000 mg | ORAL_TABLET | Freq: Four times a day (QID) | ORAL | Status: DC
  Administered 2018-01-30 – 2018-02-03 (×16): 300 mg via ORAL
  Filled 2018-01-30 (×20): qty 1.5

## 2018-01-30 MED ORDER — OXYCODONE HCL 5 MG PO TABS
5.0000 mg | ORAL_TABLET | ORAL | Status: DC | PRN
  Administered 2018-01-30 – 2018-02-10 (×30): 5 mg via ORAL
  Filled 2018-01-30 (×31): qty 1

## 2018-01-30 MED ORDER — ADULT MULTIVITAMIN LIQUID CH
15.0000 mL | Freq: Every day | ORAL | Status: DC
  Administered 2018-01-30 – 2018-02-06 (×8): 15 mL
  Filled 2018-01-30 (×8): qty 15

## 2018-01-30 MED ORDER — VITAMIN C 500 MG/5ML PO SYRP
500.0000 mg | ORAL_SOLUTION | Freq: Every day | ORAL | Status: DC
  Administered 2018-01-30 – 2018-02-07 (×9): 500 mg
  Filled 2018-01-30 (×9): qty 5

## 2018-01-30 MED ORDER — COLLAGENASE 250 UNIT/GM EX OINT
TOPICAL_OINTMENT | Freq: Every day | CUTANEOUS | Status: DC
  Administered 2018-01-30 – 2018-02-06 (×8): via TOPICAL
  Filled 2018-01-30 (×2): qty 30

## 2018-01-30 MED ORDER — ONDANSETRON HCL 4 MG/2ML IJ SOLN
4.0000 mg | Freq: Four times a day (QID) | INTRAMUSCULAR | Status: DC | PRN
  Administered 2018-01-30 – 2018-02-01 (×2): 4 mg via INTRAVENOUS
  Filled 2018-01-30: qty 2

## 2018-01-30 MED ORDER — PREDNISONE 10 MG PO TABS
10.0000 mg | ORAL_TABLET | Freq: Every day | ORAL | Status: DC
  Administered 2018-01-31 – 2018-02-03 (×4): 10 mg
  Filled 2018-01-30 (×4): qty 1

## 2018-01-30 MED ORDER — ONDANSETRON HCL 4 MG/2ML IJ SOLN
INTRAMUSCULAR | Status: AC
  Filled 2018-01-30: qty 2

## 2018-01-30 MED ORDER — PRO-STAT SUGAR FREE PO LIQD
30.0000 mL | Freq: Every day | ORAL | Status: DC
  Administered 2018-01-31 – 2018-02-07 (×8): 30 mL
  Filled 2018-01-30 (×9): qty 30

## 2018-01-30 NOTE — Consult Note (Addendum)
WOC Nurse wound consult note Reason for Consult: multiple wounds Patient long term vent patient with trach. [place] [facility] [place] [facility] [place] at which time he and his family reports he developed pressure injury on his sacrum. He has since has colostomy, unclear but I suspect diverting for large pressure injury.  Niece is in the room and begins to take photos with her phone. WOC nurse stopped her and explained unless the patient gives consent she can not take photos.  Observed patient give consent by nodding his head ok for her to take image. Explained rationale for this to the niece.   Wound type: 1-2.  Unstageable Pressure injuries; right hip 1.5cm x 2.5cm x 0.2cm proximal/3cm x 0.4cmx 0.1cm; 100% yellow 3. Stage 4 Pressure injuries; 9cm x 7cm x 7cm x undermining 5cm at 12 oclock, severe epibole of the wound edges; appears very chronic in nature 4. Deep tissue injury; right ischium: 3cm x 2.0cm x 0.1cm; partial thickness skin loss along the distal edge, otherwise dark purple non blanchable tissue 5. Open proximal midline abdominal wound; 3cmx 1.5cm x 1.5cm; 100% yellow slough; fascial sutures x 1 noted within the soupy, slough tissue.  Unable to visualize left hip and ischial wounds due to patient instability with being supine and turned to his side.  Pressure Injury POA: Yes Measurement: see above  Wound bed: see above  Drainage (amount, consistency, odor) no significant drainage except from the sacral wound,  no odor, no s/s of infection at sites assessed today.  I did discuss with the medical doctor, chronic wounds and possibility of osteomyelitis may result in heavy drainage.   Periwound: intact, I did inspect heels also, family to bring in Prevalon boots. The heels are intact at this time.  Dressing procedure/placement/frequency: Progressa bed in place for pressure redistribution and moisture management Diverting colostomy in place, new pouch placed by bedside  nurse Enzymatic debridement for the abdominal wound, saline moist packing and dry dressing topper. Enzymatic debridement for right hip wounds, saline moist gauze,and dry dressing change daily.  Prevalon boots to be brought into the room for use from the family.  Saline moist packing for the sacral wound, feel this wound will not heal without serial debridements and excision of the rolled wound edges.  Ostomy care; routine, will update orders with supply numbers.  WOC nurse will plan to attempt assessment of the left hip and ischial wounds tomorrow. Discussed POC with patient and bedside nurse.  Re consult if needed, will not follow at this time. Thanks  Jkai Arwood [employer], RN,CWOCN, CNS, CWON-AP [phone])

## 2018-01-30 NOTE — Progress Notes (Signed)
RT bag lavaged pt due to desat and thick secretions.  Pt's sat improved to 95%.  Pt placed back on full vent support with 100% FIO2.
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
## 2018-01-30 NOTE — Progress Notes (Signed)
eLink Physician-Brief Progress Note Patient Name: Malik Brooks DOB: 14-Jul-1955 MRN: [id number]   Date of Service  01/30/2018  HPI/Events of Note  Nausea  eICU Interventions  Zofran 4 mg iv Q 4 hours prn nausea        Migdalia Dk 01/30/2018, 2:58 AM

## 2018-01-30 NOTE — Progress Notes (Signed)
Initial Nutrition Assessment  DOCUMENTATION CODES:   Morbid obesity  INTERVENTION:  Recommend  -MVI -Vital High Protein @ 6850mL/hr via PEG  -Pro-stat every 24hr via PEG  Provides 1300 kcals, 120 grams of protein, and 105221m/L free water   NUTRITION DIAGNOSIS:   Inadequate oral intake related to inability to eat as evidenced by NPO status.   GOAL:   Provide needs based on ASPEN/SCCM guidelines  MONITOR:   Labs, I & O's, Weight trends, TF tolerance, Skin  REASON FOR ASSESSMENT:   Consult Enteral/tube feeding initiation and management  ASSESSMENT:  63 year old male with history significant for 5 cardiac arrests, spinal cord infarct with paraplegia, vent dependent with trach, colostomy and G-tube, DVT, COPD, HTN, CAD who presented post-arrest. Patient coded for approximately 31 minutes.    Patient with multiple pressure injuries to right hip, midline abdominal, rt ischium, and stage IV to saccrum that were present on admission.   Patient awake at visit asked RD where Malik Brooks had gone, no other family/friends in room at visit. RD suggested that he had gone to eat lunch and would be returning. Pt reports having the PEG for an extended time and receiving tube feedings prior to admission. Patient endorses some abdominal discomfort and denies any n/v/d.   BP: 140/73 MAP: 89   Medications reviewed and include: Levophed 4mg , Protonix, Prednisone, Vancomycin, Fentanyl, Oxycodone  No pertinent labs  I/O: -501.3 ml since admit UOP: 495 ml x 24 hrs Gtube: 75 ml x 24 hrs Colostomy: 150 ml x 24 hrs   NUTRITION - FOCUSED PHYSICAL EXAM:    Most Recent Value  Orbital Region  No depletion  Upper Arm Region  No depletion  Thoracic and Lumbar Region  No depletion  Buccal Region  Mild depletion  Temple Region  No depletion  Clavicle Bone Region  No depletion  Clavicle and Acromion Bone Region  No depletion  Scapular Bone Region  No depletion  Dorsal Hand  No depletion  Patellar  Region  Unable to assess [pt w/ paraplegia ]  Anterior Thigh Region  Unable to assess [pt w/ paraplegia]  Posterior Calf Region  Unable to assess [pt w/ paraplegia]  Edema (RD Assessment)  Mild [generalized +1]  Hair  Reviewed  Eyes  Reviewed  Mouth  Reviewed  Skin  Reviewed  Nails  Reviewed      Diet Order:   Diet Order            Diet NPO time specified  Diet effective now              EDUCATION NEEDS:   No education needs have been identified at this time  Skin:  Skin Assessment: Reviewed RN Assessment Skin Integrity Issues:: Stage IV Stage IV: saccrum  Last BM:  12/16/2018; type 7; brown; green; medium(colostomy)  Height:   Ht Readings from Last 1 Encounters:  012/01/2018 5\' 5"  (1.651 m)    Weight:   Wt Readings from Last 1 Encounters:  01/30/18 123 kg    Ideal Body Weight:  61.8 kg Adjusted Ideal Body Weight: 58.6kg  BMI:  Body mass index is 45.12 kg/m.  Estimated Nutritional Needs:   Kcal:  4098-11911276-1489 (PSU)  Protein:  106-117 grams (1.8-2g/kg/adjusted IBW)  Fluid:  </=2L/day    Malik Brooks, RD, LDN  After Hours/Weekend Pager: [phone]

## 2018-01-30 NOTE — Plan of Care (Signed)
  Problem: Education: Goal: Knowledge of General Education information will improve Description Including pain rating scale, medication(s)/side effects and non-pharmacologic comfort measures Outcome: Progressing   Problem: Clinical Measurements: Goal: Diagnostic test results will improve Outcome: Progressing Goal: Respiratory complications will improve Outcome: Progressing   Problem: Coping: Goal: Level of anxiety will decrease Outcome: Progressing   Problem: Pain Managment: Goal: General experience of comfort will improve Outcome: Progressing   Problem: Safety: Goal: Ability to remain free from injury will improve Outcome: Progressing   Problem: Skin Integrity: Goal: Risk for impaired skin integrity will decrease Outcome: Progressing   Problem: Activity: Goal: Risk for activity intolerance will decrease Outcome: Not Progressing

## 2018-01-31 ENCOUNTER — Inpatient Hospital Stay (HOSPITAL_COMMUNITY): Payer: Medicare Other

## 2018-01-31 DIAGNOSIS — J9621 Acute and chronic respiratory failure with hypoxia: Secondary | ICD-10-CM | POA: Diagnosis not present

## 2018-01-31 DIAGNOSIS — J9622 Acute and chronic respiratory failure with hypercapnia: Secondary | ICD-10-CM | POA: Diagnosis not present

## 2018-01-31 DIAGNOSIS — I469 Cardiac arrest, cause unspecified: Secondary | ICD-10-CM | POA: Diagnosis not present

## 2018-01-31 LAB — CBC
HCT: 24.1 % — ABNORMAL LOW (ref 39.0–52.0)
Hemoglobin: 7.3 g/dL — ABNORMAL LOW (ref 13.0–17.0)
MCH: 28.6 pg (ref 26.0–34.0)
MCHC: 30.3 g/dL (ref 30.0–36.0)
MCV: 94.5 fL (ref 80.0–100.0)
Platelets: 209 10*3/uL (ref 150–400)
RBC: 2.55 MIL/uL — AB (ref 4.22–5.81)
RDW: 19.7 % — ABNORMAL HIGH (ref 11.5–15.5)
WBC: 11 10*3/uL — ABNORMAL HIGH (ref 4.0–10.5)
nRBC: 0 % (ref 0.0–0.2)

## 2018-01-31 LAB — GLUCOSE, CAPILLARY
GLUCOSE-CAPILLARY: 128 mg/dL — AB (ref 70–99)
Glucose-Capillary: 108 mg/dL — ABNORMAL HIGH (ref 70–99)
Glucose-Capillary: 119 mg/dL — ABNORMAL HIGH (ref 70–99)
Glucose-Capillary: 129 mg/dL — ABNORMAL HIGH (ref 70–99)
Glucose-Capillary: 140 mg/dL — ABNORMAL HIGH (ref 70–99)
Glucose-Capillary: 156 mg/dL — ABNORMAL HIGH (ref 70–99)
Glucose-Capillary: 156 mg/dL — ABNORMAL HIGH (ref 70–99)

## 2018-01-31 LAB — RENAL FUNCTION PANEL
ANION GAP: 11 (ref 5–15)
Albumin: 1.9 g/dL — ABNORMAL LOW (ref 3.5–5.0)
BUN: 23 mg/dL (ref 8–23)
CO2: 27 mmol/L (ref 22–32)
Calcium: 8.2 mg/dL — ABNORMAL LOW (ref 8.9–10.3)
Chloride: 101 mmol/L (ref 98–111)
Creatinine, Ser: 0.53 mg/dL — ABNORMAL LOW (ref 0.61–1.24)
GFR calc Af Amer: >60 mL/min (ref >60)
GFR calc non Af Amer: >60 mL/min (ref >60)
Glucose, Bld: 156 mg/dL — ABNORMAL HIGH (ref 70–99)
Phosphorus: 2.2 mg/dL — ABNORMAL LOW (ref 2.5–4.6)
Potassium: 3.3 mmol/L — ABNORMAL LOW (ref 3.5–5.1)
Sodium: 139 mmol/L (ref 135–145)

## 2018-01-31 LAB — ECHOCARDIOGRAM COMPLETE
Height: 65 in
WEIGHTICAEL: 4440.95 [oz_av]

## 2018-01-31 LAB — PROCALCITONIN: PROCALCITONIN: 3.21 ng/mL

## 2018-01-31 LAB — URINE CULTURE: Culture: <10000 — AB

## 2018-01-31 LAB — PHOSPHORUS: Phosphorus: 2.8 mg/dL (ref 2.5–4.6)

## 2018-01-31 LAB — MAGNESIUM
Magnesium: 1.8 mg/dL (ref 1.7–2.4)
Magnesium: 1.7 mg/dL (ref 1.7–2.4)

## 2018-01-31 MED ORDER — PERFLUTREN LIPID MICROSPHERE
1.0000 mL | INTRAVENOUS | Status: AC | PRN
  Administered 2018-01-31: 4 mL via INTRAVENOUS
  Filled 2018-01-31: qty 10

## 2018-01-31 MED ORDER — PIVOT 1.5 CAL PO LIQD
1000.0000 mL | ORAL | Status: DC
  Administered 2018-02-01 – 2018-02-06 (×6): 1000 mL
  Filled 2018-01-31 (×12): qty 1000

## 2018-01-31 MED ORDER — SODIUM PHOSPHATES 45 MMOLE/15ML IV SOLN
20.0000 mmol | Freq: Once | INTRAVENOUS | Status: AC
  Administered 2018-01-31: 20 mmol via INTRAVENOUS
  Filled 2018-01-31: qty 6.67

## 2018-01-31 MED ORDER — POTASSIUM CHLORIDE 20 MEQ/15ML (10%) PO SOLN
20.0000 meq | ORAL | Status: AC
  Administered 2018-01-31 (×2): 20 meq
  Filled 2018-01-31 (×2): qty 15

## 2018-01-31 NOTE — Progress Notes (Signed)
CPT not done at this time, pt is sleeping.

## 2018-01-31 NOTE — Progress Notes (Signed)
Nutrition Follow-up  DOCUMENTATION CODES:   Morbid obesity  INTERVENTION:   Tube Feeding:  Change to Pivot 1.5 @ 60 ml/hr Pro-Stat 30 mL daily Provides 150 g of protein, 2260 kcals and 1094 mL of free water  NUTRITION DIAGNOSIS:   Inadequate oral intake related to inability to eat as evidenced by NPO status.  Being addressed via TF   GOAL:   Provide needs based on ASPEN/SCCM guidelines  Progressing  MONITOR:   Labs, I & O's, Weight trends, TF tolerance, Skin  REASON FOR ASSESSMENT:   Consult Enteral/tube feeding initiation and management  ASSESSMENT:   63 year old male with history significant for 5 cardiac arrests, spinal cord infarct with paraplegia, vent dependent with trach, colostomy and G-tube, DVT, COPD, HTN, CAD who presented post-arrest. Patient coded for approximately 31 minutes.   Pt alert/awake on vent support via trach MV: 12.8 L/min Temp (24hrs), Avg:98.6 F (37 C), Min:98.2 F (36.8 C), Max:98.8 F (37.1 C)  Vital High Protein @ 50 ml/hr. Pro-Stat daily  Pt with height of 5'5" in chart.  Pt reports height of 5'8 " to 5'9". Height change in system  Net negative 1.4 L since admission. Current wt 125.9 kg; admission wt 125.3 kg. Weight yesterday 123 kg.   Labs: potassium 3.3, phosphorus 2.2, corrected calcium 9.9 (albumin 1.9), CBGs 103-140 Meds:   Diet Order:   Diet Order            Diet NPO time specified  Diet effective now              EDUCATION NEEDS:   No education needs have been identified at this time  Skin:  Skin Assessment: Reviewed RN Assessment Skin Integrity Issues:: Unstageable, Stage IV, DTI, Other (Comment) DTI: right ishcium Stage IV: sacrum Unstageable: hip x 2 Other: open midline abdominal wound (soupy, slough tissue)  Last BM:  1/17 colostomy  Height:   Ht Readings from Last 1 Encounters:  01/31/18 5' 8.5" (1.74 m)    Weight:   Wt Readings from Last 1 Encounters:  01/31/18 125.9 kg    Ideal Body  Weight:  68 kg  BMI:  Body mass index is 41.59 kg/m.  Estimated Nutritional Needs:   Kcal:  1750-2300 kcals   Protein:  135-170 g  Fluid:  </=2L/day   Romelle Starcher MS, RD, LDN, CNSC [phone] Pager  [phone] Weekend/On-Call Pager

## 2018-01-31 NOTE — Consult Note (Signed)
WOC Nurse wound follow up  WOC nurse back to attempt to assess left hip and ischial wounds. Patient is refusing to have dressing changes. He is refusing to have a bath.  He is refusing to turn.  Explained need to see wounds to recommend the appropriate care.  Wound type: See previous note for other wounds assessed 01/30/18 Left hip medial: Unstageable Pressure Injury; 2cmx 1cm x 0.1cm; 100% yellow Left ischium: Deep Tissue Pressure Injury: 5cm x 3.5cm x 0.1cm; partial thickness skin loss over 1/2 of dark purple nonblanchable skin Perirectal; Deep tissue injury; 2cm x 2cm x 0cm; 100% dark non blanchable skin  Measurement:see above Wound bed:see above  Drainage (amount, consistency, odor) no significant drainage from any of the wounds assessed today Periwound: I ntact  Dressing procedure/placement/frequency: Add enzymatic debridement ointment to the most medial left hip wound daily.  Foam to the left ischial wound.  Maximize nutrition, spoke with RD on current status of wounds.  Discussed with CCM at the bedside.  Orders updated, assisted with dressing application/change for sacrum, left hip, left ischium.   No family in the room   Discussed POC with bedside nurse.  Re consult if needed, will not follow at this time. Thanks  Lynton Crescenzo [employer], RN,CWOCN, CNS, CWON-AP [phone])

## 2018-01-31 NOTE — Care Management Note (Signed)
Case Management Note Malik Conradi, RN MSN CCM Transition of Care [facility] [place] [phone]  Patient Details  Name: Malik Brooks MRN: [id number] Date of Birth: Dec 04, 1955  Subjective/Objective:           Cardiac arrest         Action/Plan: PTA [facility]. Paraplegic. Trach-vent dependent. Colostomy. Peg. Multiple pressure injuries. Received call from MD to look at options for transition home. Verified with [facility] LTACH-no LTACH benefits at this time. Discussed with CSW vent snf options. Spoke with patient's representative, Malik Brooks ([phone]. Malik Brooks stated that they have been working with Malik Brooks at [facility] [phone]) to get patient set up to be cared for at home-Malik Brooks stated that [place] that it has been difficult to find caregivers. Malik Brooks stated that she wanted to get patient home even if with hospice care-discussed that the vent disqualifies patient from hospice care. Discussed option of vent snf and possibility of being placed out of state. Malik Brooks verbalized understanding of limited placement options. Discussed home vents with Malik Brooks-[facility]. DME order placed for home vent assessment. Will continue to follow for transition of care needs.   Expected Discharge Date:                  Expected Discharge Plan:  Skilled Nursing Facility  In-House Referral:  Clinical Social Work  Discharge planning Services  CM Consult  Post Acute Care Choice:  Home Health Choice offered to:  [facility] POA / Guardian  DME Arranged:    DME Agency:     HH Arranged:    HH Agency:     Status of Service:  In process, will continue to follow  If discussed at Long Length of Stay Meetings, dates discussed:    Additional Comments:  Malik Kinds, RN 01/31/2018, 2:13 PM
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
## 2018-02-01 ENCOUNTER — Inpatient Hospital Stay (HOSPITAL_COMMUNITY): Payer: Medicare Other

## 2018-02-01 DIAGNOSIS — J9622 Acute and chronic respiratory failure with hypercapnia: Secondary | ICD-10-CM | POA: Diagnosis not present

## 2018-02-01 DIAGNOSIS — J9621 Acute and chronic respiratory failure with hypoxia: Secondary | ICD-10-CM | POA: Diagnosis not present

## 2018-02-01 LAB — POCT I-STAT 3, ART BLOOD GAS (G3+)
Acid-Base Excess: 7 mmol/L — ABNORMAL HIGH (ref 0.0–2.0)
Bicarbonate: 33.5 mmol/L — ABNORMAL HIGH (ref 20.0–28.0)
O2 Saturation: 86 %
Patient temperature: 36.5
TCO2: 35 mmol/L — AB (ref 22–32)
pCO2 arterial: 60.4 mmHg — ABNORMAL HIGH (ref 32.0–48.0)
pH, Arterial: 7.351 (ref 7.350–7.450)
pO2, Arterial: 54 mmHg — ABNORMAL LOW (ref 83.0–108.0)

## 2018-02-01 LAB — CULTURE, RESPIRATORY W GRAM STAIN: Culture: NORMAL

## 2018-02-01 LAB — BASIC METABOLIC PANEL
Anion gap: 7 (ref 5–15)
BUN: 18 mg/dL (ref 8–23)
CO2: 30 mmol/L (ref 22–32)
Calcium: 8.3 mg/dL — ABNORMAL LOW (ref 8.9–10.3)
Chloride: 103 mmol/L (ref 98–111)
Creatinine, Ser: 0.48 mg/dL — ABNORMAL LOW (ref 0.61–1.24)
GFR calc Af Amer: >60 mL/min (ref >60)
Glucose, Bld: 155 mg/dL — ABNORMAL HIGH (ref 70–99)
POTASSIUM: 3.2 mmol/L — AB (ref 3.5–5.1)
Sodium: 140 mmol/L (ref 135–145)

## 2018-02-01 LAB — GLUCOSE, CAPILLARY
Glucose-Capillary: 122 mg/dL — ABNORMAL HIGH (ref 70–99)
Glucose-Capillary: 152 mg/dL — ABNORMAL HIGH (ref 70–99)
Glucose-Capillary: 137 mg/dL — ABNORMAL HIGH (ref 70–99)
Glucose-Capillary: 139 mg/dL — ABNORMAL HIGH (ref 70–99)
Glucose-Capillary: 177 mg/dL — ABNORMAL HIGH (ref 70–99)
Glucose-Capillary: 165 mg/dL — ABNORMAL HIGH (ref 70–99)
Glucose-Capillary: 134 mg/dL — ABNORMAL HIGH (ref 70–99)

## 2018-02-01 MED ORDER — POTASSIUM CHLORIDE 20 MEQ/15ML (10%) PO SOLN
20.0000 meq | ORAL | Status: AC
  Administered 2018-02-01 (×2): 20 meq
  Filled 2018-02-01 (×2): qty 15

## 2018-02-01 MED ORDER — ZOLPIDEM TARTRATE 5 MG PO TABS
5.0000 mg | ORAL_TABLET | Freq: Every evening | ORAL | Status: DC | PRN
  Administered 2018-02-01 – 2018-02-03 (×3): 5 mg
  Filled 2018-02-01 (×3): qty 1

## 2018-02-01 MED ORDER — CLONAZEPAM 0.5 MG PO TABS
0.5000 mg | ORAL_TABLET | Freq: Three times a day (TID) | ORAL | Status: DC | PRN
  Administered 2018-02-01 – 2018-02-10 (×17): 0.5 mg
  Filled 2018-02-01 (×17): qty 1

## 2018-02-01 NOTE — Progress Notes (Signed)
Pt. refused bath and posterior dressing changes. Pt. informed about the potential consequences of refusing personal hygiene and wound care. The patient said, "I know". Will continue to educated.

## 2018-02-01 NOTE — Progress Notes (Signed)
CPT not done at this time due to pt request.
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
## 2018-02-01 NOTE — Progress Notes (Signed)
ABG values given to W. Minor NP.

## 2018-02-01 NOTE — Progress Notes (Signed)
Patient continued to refuse care overnight. Patient refused turns and would not allow [place] to bathe him. Explained at length why he needed to have a bath to avoid infections but patient was still not compliant. State he would get a bath during the day. Information shared with day shift RN.   Noe Gens, RN

## 2018-02-01 NOTE — Progress Notes (Signed)
CPT not done at this time due to pt's request, due chest pain from prior CPR.

## 2018-02-01 NOTE — Progress Notes (Signed)
Pharmacy Antibiotic Note  Malik Brooks is a 63 y.o. male admitted from [facility] on 02-02-2018 s/p cardiac arrest. Pharmacy has been consulted for vancomycin and cefepime dosing for sepsis. All cultures are no growth so far. WBC down 11 (on prednisone) and has been AF. Noted patient is paraplegic. SCr 0.48, estimated CrCl >100 mL/min.  Plan: Continue vancomycin 1250mg  IV q12h (estimated AUC 503, goal 400-550) Continue cefepime 2g IV q8h F/u clinical status, C&S, renal function, de-escalation, LOT, vancomycin levels as appropriate  Height: 5' 8.5" (174 cm) Weight: 277 lb 9 oz (125.9 kg) IBW/kg (Calculated) : 69.55  Temp (24hrs), Avg:98.3 F (36.8 C), Min:97.7 F (36.5 C), Max:98.8 F (37.1 C)  Recent Labs  Lab 2018-02-02 1533 02-02-18 1543 2018/02/02 1549 2018-02-02 2026 01/30/18 0503 01/31/18 0348 02/01/18 0420  WBC 27.0*  --   --  27.1* 16.3* 11.0*  --   CREATININE 0.65  --  0.60* 0.51* 0.45* 0.53* 0.48*  LATICACIDVEN  --  6.25*  --  0.9  --   --   --     Estimated Creatinine Clearance: 121.5 mL/min (A) (by C-G formula based on SCr of 0.48 mg/dL (L)).    No Known Allergies   Antibiotics this admission Vanc 1/15 >> Cefepime 1/15 >> Flagyl 1/15 >> 1/16  Microbiology 1/15 UCx - insignificant growth 1/15 BCx - NG x2 1/15 TA - normal flora  Malik Brooks, PharmD, BCPS PGY2 Infectious Diseases Pharmacy Resident Phone: [phone] 02/01/2018, 10:46 AM

## 2018-02-02 ENCOUNTER — Inpatient Hospital Stay (HOSPITAL_COMMUNITY): Payer: Medicare Other

## 2018-02-02 DIAGNOSIS — J9621 Acute and chronic respiratory failure with hypoxia: Secondary | ICD-10-CM | POA: Diagnosis not present

## 2018-02-02 DIAGNOSIS — I469 Cardiac arrest, cause unspecified: Secondary | ICD-10-CM | POA: Diagnosis not present

## 2018-02-02 DIAGNOSIS — T17998A Other foreign object in respiratory tract, part unspecified causing other injury, initial encounter: Secondary | ICD-10-CM | POA: Diagnosis not present

## 2018-02-02 DIAGNOSIS — J9622 Acute and chronic respiratory failure with hypercapnia: Secondary | ICD-10-CM | POA: Diagnosis not present

## 2018-02-02 LAB — CBC WITH DIFFERENTIAL/PLATELET
Abs Immature Granulocytes: 0.06 10*3/uL (ref 0.00–0.07)
Basophils Absolute: 0 10*3/uL (ref 0.0–0.1)
Basophils Relative: 0 %
Eosinophils Absolute: 0.1 10*3/uL (ref 0.0–0.5)
Eosinophils Relative: 1 %
HCT: 26 % — ABNORMAL LOW (ref 39.0–52.0)
Hemoglobin: 7.3 g/dL — ABNORMAL LOW (ref 13.0–17.0)
Immature Granulocytes: 1 %
Lymphocytes Relative: 8 %
Lymphs Abs: 0.9 10*3/uL (ref 0.7–4.0)
MCH: 27.2 pg (ref 26.0–34.0)
MCHC: 28.1 g/dL — AB (ref 30.0–36.0)
MCV: 97 fL (ref 80.0–100.0)
Monocytes Absolute: 0.8 10*3/uL (ref 0.1–1.0)
Monocytes Relative: 7 %
Neutro Abs: 9 10*3/uL — ABNORMAL HIGH (ref 1.7–7.7)
Neutrophils Relative %: 83 %
Platelets: 172 10*3/uL (ref 150–400)
RBC: 2.68 MIL/uL — ABNORMAL LOW (ref 4.22–5.81)
RDW: 19.9 % — ABNORMAL HIGH (ref 11.5–15.5)
WBC: 10.9 10*3/uL — ABNORMAL HIGH (ref 4.0–10.5)
nRBC: 0 % (ref 0.0–0.2)

## 2018-02-02 LAB — BASIC METABOLIC PANEL
Anion gap: 6 (ref 5–15)
BUN: 22 mg/dL (ref 8–23)
CO2: 32 mmol/L (ref 22–32)
CREATININE: 0.36 mg/dL — AB (ref 0.61–1.24)
Calcium: 8.5 mg/dL — ABNORMAL LOW (ref 8.9–10.3)
Chloride: 106 mmol/L (ref 98–111)
GFR calc Af Amer: >60 mL/min (ref >60)
GFR calc non Af Amer: >60 mL/min (ref >60)
Glucose, Bld: 150 mg/dL — ABNORMAL HIGH (ref 70–99)
Potassium: 3.7 mmol/L (ref 3.5–5.1)
Sodium: 144 mmol/L (ref 135–145)

## 2018-02-02 LAB — GLUCOSE, CAPILLARY
Glucose-Capillary: 106 mg/dL — ABNORMAL HIGH (ref 70–99)
Glucose-Capillary: 142 mg/dL — ABNORMAL HIGH (ref 70–99)
Glucose-Capillary: 143 mg/dL — ABNORMAL HIGH (ref 70–99)
Glucose-Capillary: 143 mg/dL — ABNORMAL HIGH (ref 70–99)
Glucose-Capillary: 147 mg/dL — ABNORMAL HIGH (ref 70–99)
Glucose-Capillary: 169 mg/dL — ABNORMAL HIGH (ref 70–99)

## 2018-02-02 LAB — PHOSPHORUS: Phosphorus: 2.3 mg/dL — ABNORMAL LOW (ref 2.5–4.6)

## 2018-02-02 LAB — MAGNESIUM: Magnesium: 1.8 mg/dL (ref 1.7–2.4)

## 2018-02-02 MED ORDER — SODIUM CHLORIDE 0.9% FLUSH
10.0000 mL | Freq: Two times a day (BID) | INTRAVENOUS | Status: DC
  Administered 2018-02-03 – 2018-02-07 (×8): 10 mL

## 2018-02-02 MED ORDER — FENTANYL CITRATE (PF) 100 MCG/2ML IJ SOLN
50.0000 ug | Freq: Once | INTRAMUSCULAR | Status: AC
  Administered 2018-02-02: 50 ug via INTRAVENOUS

## 2018-02-02 MED ORDER — MIDAZOLAM HCL 2 MG/2ML IJ SOLN
3.0000 mg | Freq: Once | INTRAMUSCULAR | Status: AC
  Administered 2018-02-02: 3 mg via INTRAVENOUS
  Administered 2018-02-03: 2 mg via INTRAVENOUS
  Filled 2018-02-02: qty 4

## 2018-02-02 MED ORDER — SODIUM CHLORIDE 0.9% FLUSH: 10.0000 mL | INTRAVENOUS | Status: DC | PRN

## 2018-02-02 MED ORDER — ACETYLCYSTEINE 20 % IN SOLN
3.0000 mL | Freq: Once | RESPIRATORY_TRACT | Status: DC
  Filled 2018-02-02: qty 4

## 2018-02-02 MED ORDER — CHLORHEXIDINE GLUCONATE CLOTH 2 % EX PADS
6.0000 | MEDICATED_PAD | Freq: Every day | CUTANEOUS | Status: DC
  Administered 2018-02-02 – 2018-02-07 (×5): 6 via TOPICAL

## 2018-02-02 NOTE — Progress Notes (Signed)
Patient  received morning CXR and suddenly sats were dropping. O2 Sat probe changed with good wave form at 79%. Attempted to suction with large amount of thick white secretions aspirated. Patient's sats not improving. Called RT to assist. Pt was bag lavaged and eventually came up into 90s. Patient now on 100% FiO2. Will monitor closely.  Noe Gens, RN

## 2018-02-02 NOTE — Procedures (Signed)
Bronchoscopy Procedure Note Malik Brooks [id number] 30-Sep-1955  Procedure: Bronchoscopy Indications: Remove secretions  Procedure Details Consent: Risks of procedure as well as the alternatives and risks of each were explained to the (patient/caregiver).  Consent for procedure obtained. Time Out: Verified patient identification, verified procedure, site/side was marked, verified correct patient position, special equipment/implants available, medications/allergies/relevent history reviewed, required imaging and test results available.  Performed  In preparation for procedure, patient was given 100% FiO2 and bronchoscope lubricated. Sedation: Benzodiazepines and Muscle relaxants  Airway entered and the following bronchi were examined: RUL, RML, RLL, LUL and LLL.   Procedures performed: Brushings performed Bronchoscope removed.  , Patient placed back on 100% FiO2 at conclusion of procedure.    Evaluation Hemodynamic Status: BP stable throughout; O2 sats: transiently fell during during procedure Patient's Current Condition: stable Specimens:  None Complications: No apparent complications Patient did tolerate procedure well.   Claudean Severance 02/02/2018

## 2018-02-02 NOTE — Progress Notes (Signed)
Pt desat to 78% RT bag lavaged pt with RN at bedside. After several minutes pt sats returned to 90's RT suctioned pt at that time with small return. RT then placed back on vent. Pt tolerated well, RT to continue to monitor as needed.

## 2018-02-02 NOTE — Progress Notes (Signed)
Attempted to use metaneb in the place of CPT but patient did not tolerate. Continued with CPT through the bed.
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
## 2018-02-03 ENCOUNTER — Inpatient Hospital Stay (HOSPITAL_COMMUNITY): Payer: Medicare Other

## 2018-02-03 DIAGNOSIS — Z515 Encounter for palliative care: Secondary | ICD-10-CM | POA: Diagnosis not present

## 2018-02-03 DIAGNOSIS — J9601 Acute respiratory failure with hypoxia: Secondary | ICD-10-CM | POA: Diagnosis not present

## 2018-02-03 DIAGNOSIS — I469 Cardiac arrest, cause unspecified: Secondary | ICD-10-CM | POA: Diagnosis not present

## 2018-02-03 LAB — POCT I-STAT 3, ART BLOOD GAS (G3+)
Acid-Base Excess: 9 mmol/L — ABNORMAL HIGH (ref 0.0–2.0)
Bicarbonate: 37.7 mmol/L — ABNORMAL HIGH (ref 20.0–28.0)
O2 Saturation: 95 %
Patient temperature: 98.6
TCO2: 40 mmol/L — ABNORMAL HIGH (ref 22–32)
pCO2 arterial: 88.3 mmHg (ref 32.0–48.0)
pH, Arterial: 7.239 — ABNORMAL LOW (ref 7.350–7.450)
pO2, Arterial: 96 mmHg (ref 83.0–108.0)

## 2018-02-03 LAB — GLUCOSE, CAPILLARY
GLUCOSE-CAPILLARY: 111 mg/dL — AB (ref 70–99)
Glucose-Capillary: 133 mg/dL — ABNORMAL HIGH (ref 70–99)
Glucose-Capillary: 150 mg/dL — ABNORMAL HIGH (ref 70–99)
Glucose-Capillary: 178 mg/dL — ABNORMAL HIGH (ref 70–99)
Glucose-Capillary: 184 mg/dL — ABNORMAL HIGH (ref 70–99)
Glucose-Capillary: 199 mg/dL — ABNORMAL HIGH (ref 70–99)

## 2018-02-03 LAB — CULTURE, BLOOD (ROUTINE X 2)
Culture: NO GROWTH
Culture: NO GROWTH
Special Requests: ADEQUATE
Special Requests: ADEQUATE

## 2018-02-03 LAB — CBC
HCT: 24.1 % — ABNORMAL LOW (ref 39.0–52.0)
Hemoglobin: 6.6 g/dL — CL (ref 13.0–17.0)
MCH: 27.4 pg (ref 26.0–34.0)
MCHC: 27.4 g/dL — ABNORMAL LOW (ref 30.0–36.0)
MCV: 100 fL (ref 80.0–100.0)
Platelets: 148 10*3/uL — ABNORMAL LOW (ref 150–400)
RBC: 2.41 MIL/uL — ABNORMAL LOW (ref 4.22–5.81)
RDW: 20.2 % — ABNORMAL HIGH (ref 11.5–15.5)
WBC: 8.5 10*3/uL (ref 4.0–10.5)
nRBC: 0 % (ref 0.0–0.2)

## 2018-02-03 LAB — BASIC METABOLIC PANEL
Anion gap: 6 (ref 5–15)
BUN: 29 mg/dL — ABNORMAL HIGH (ref 8–23)
CO2: 32 mmol/L (ref 22–32)
Calcium: 8.3 mg/dL — ABNORMAL LOW (ref 8.9–10.3)
Chloride: 106 mmol/L (ref 98–111)
Creatinine, Ser: 0.42 mg/dL — ABNORMAL LOW (ref 0.61–1.24)
GFR calc Af Amer: >60 mL/min (ref >60)
GFR calc non Af Amer: >60 mL/min (ref >60)
Glucose, Bld: 148 mg/dL — ABNORMAL HIGH (ref 70–99)
Potassium: 3.9 mmol/L (ref 3.5–5.1)
Sodium: 144 mmol/L (ref 135–145)

## 2018-02-03 LAB — POCT I-STAT 3, VENOUS BLOOD GAS (G3P V)
ACID-BASE EXCESS: 7 mmol/L — AB (ref 0.0–2.0)
Bicarbonate: 36.5 mmol/L — ABNORMAL HIGH (ref 20.0–28.0)
O2 Saturation: 78 %
TCO2: 39 mmol/L — ABNORMAL HIGH (ref 22–32)
pCO2, Ven: 85.4 mmHg (ref 44.0–60.0)
pH, Ven: 7.238 — ABNORMAL LOW (ref 7.250–7.430)
pO2, Ven: 52 mmHg — ABNORMAL HIGH (ref 32.0–45.0)

## 2018-02-03 LAB — HEMOGLOBIN AND HEMATOCRIT, BLOOD
HEMATOCRIT: 30.6 % — AB (ref 39.0–52.0)
Hemoglobin: 8.5 g/dL — ABNORMAL LOW (ref 13.0–17.0)

## 2018-02-03 LAB — PREPARE RBC (CROSSMATCH)

## 2018-02-03 MED ORDER — MIDAZOLAM HCL 2 MG/2ML IJ SOLN
INTRAMUSCULAR | Status: AC
  Administered 2018-02-03: 2 mg via INTRAVENOUS
  Filled 2018-02-03: qty 4

## 2018-02-03 MED ORDER — SODIUM CHLORIDE 0.9% IV SOLUTION: Freq: Once | INTRAVENOUS | Status: DC

## 2018-02-03 MED ORDER — VANCOMYCIN HCL 10 G IV SOLR
1250.0000 mg | Freq: Two times a day (BID) | INTRAVENOUS | Status: DC
  Administered 2018-02-03 – 2018-02-04 (×3): 1250 mg via INTRAVENOUS
  Filled 2018-02-03 (×4): qty 1250

## 2018-02-03 MED ORDER — FUROSEMIDE 10 MG/ML IJ SOLN
40.0000 mg | Freq: Once | INTRAMUSCULAR | Status: AC
  Administered 2018-02-03: 40 mg via INTRAVENOUS
  Filled 2018-02-03: qty 4

## 2018-02-03 MED ORDER — PREDNISONE 20 MG PO TABS
40.0000 mg | ORAL_TABLET | Freq: Every day | ORAL | Status: DC
  Administered 2018-02-04 – 2018-02-07 (×4): 40 mg
  Filled 2018-02-03 (×4): qty 2

## 2018-02-03 MED ORDER — SODIUM CHLORIDE 0.9 % IV SOLN
2.0000 g | Freq: Three times a day (TID) | INTRAVENOUS | Status: DC
  Administered 2018-02-03 – 2018-02-06 (×9): 2 g via INTRAVENOUS
  Filled 2018-02-03 (×10): qty 2

## 2018-02-03 NOTE — Consult Note (Signed)
Palliative Care Consult Note  Met with patient and his nephew Malik Brooks (Malik Brooks is HCPOA) she is on her way.   Patient is distressed on the the vent to the degree that it is difficult for me to communicate with him-I was asked to do a capacity determination and to discuss code status and care coordination re: transfer home with full vent support.  He has no breath sounds on left, increasing peak pressures, significant drop in Hb 8.4-->6.6 with no clear source or cause, new hemoptysis, hypotensive, de-sating. I called CCM to evaluate urgently for possible left hemothorax.  I strongly advised DNR, but nephew said the patient was too distressed to make that decision- he said their goal is to get him home even if he dies there- "after all this lets at least get him home even if it is only for a short time- I think we should do this for him given what he has been through".  Chronic Ventilator Dependence, Colostomy, PEG tube, Stage IV decubitus, chronically critically ill -overall very poor prognosis.  I requested RN give PRN fentanyl for patients severe distress. Patient too unstable to discuss goals of care. Will follow and assist as needed with goals.  Malik Hacker, DO Palliative Medicine [phone]  Time: 50 min Greater than 50%  of this time was spent counseling and coordinating care related to the above assessment and plan.

## 2018-02-03 NOTE — Progress Notes (Signed)
CRITICAL VALUE ALERT  Critical Value:  Hemoglobin 6.6  Date & Time Notied:  02/03/2018 ; 620  Provider Notified: CCM notified via Elink  Orders Received/Actions taken: Awaiting orders. Patient asymptomatic.  Will pass to day shift as well.   Noe GensStefanie A Reegan Bouffard, RN

## 2018-02-03 NOTE — Progress Notes (Signed)
Upon arrival to patient room, patient still noted to have increased work of breathing, as if gasping for air.  Patient still diminished on left side.  Peak pressures on ventilator are increasing to the 50s.  Attempted to lavage patient however only able to obtain a small amount of thick, pink tinged secretions.  Still no improvement in patient respiratory status or peak pressures.  Was given a nebulizer treatment at 1427.  Pallative MD concerned with possible hemothorax.  CCM MD paged at 1520 and is to come to patient's room once finished with current patient.  Will continue to monitor.

## 2018-02-03 NOTE — Progress Notes (Signed)
Patient has been receiving CPT through metaneb.  This AM, while performing CPT after 5 minutes on metaneb patient's sats began to drop to 85%.  Stopped metaneb and placed patient back on full support ventilation and lavaged patient and was able to obtain a moderate amount of thick, tenacious, yellow secretions.  Sats currently at 95%.  Will continue to monitor.

## 2018-02-03 NOTE — Progress Notes (Signed)
S:  Called to bedside for hemoptysis.  RN reports pt has decreased breath sounds on left, when turns to left pt desaturates to 70's.  RT reports high peak pressures earlier in shift but has since resolved.  Pt reports shortness of breath.   O: Vitals:   02/03/18 1130 02/03/18 1145  BP: (!) 107/55 (!) 109/57  Pulse: (!) 108 99  Resp: (!) 28 (!) 25  Temp: 99.1 F (37.3 C) 99.1 F (37.3 C)  SpO2: (!) 84% 98%   General: obese male lying in bed, appears short of breath HEENT: MM pink/moist, [place] trach midline, no bleeding from trach externally Neuro: Awake, alert, nods appropriately  CV: s1s2 rrr, tachy, no m/r/g PULM: mildly labored /paradoxical breathing pattern, diminished to no breath sounds on left, good air movement on right, small amt bright red bloody secretions in ballard, old secretions in suction cannister are yellow / pink tinged WU:JWJXGI:soft, non-tender, bsx4 active  Extremities: warm/dry, generalized 1+ edema  Skin: no rashes or lesions  A: Acute on Chronic Hypoxic Respiratory Failure in setting of LLL Airspace Disease -recent treatment for PNA, currently on empiric abx -episodes of frequent mucus plugging, completed mucomyst, does not tolerate percussion -FOB on 1/19   Diminished L Breath Sounds, R/O Pneumothorax   ? Airway Trauma from Suctioning / FOB  -completed mucomyst, frequent suctioning, FOB 1/19  Bilateral Pleural Effusions -on lasix at home   P: Monitor bleeding from trachesostomy Minimize suctioning as able  Now CXR to r/o pneumothorax  Continue current vent settings Lasix 40 mg IV x1 (is going to receive 1 unit PRBC), takes lasix at home Follow up CXR in am    Malik BrimBrandi Lashanta Elbe, NP-C [facility] Pgr: [phone] or if no answer [phone] 02/03/2018, 12:01 PM

## 2018-02-03 NOTE — Progress Notes (Signed)
MD came to patient room due to patient having increased work of breathing, decreased breath sounds, and increased peak pressures on ventilator.  ABG was ordered and obtained, results are as follows.  STAT chest xray was obtained.  MD performed bronchoscopy and was able to clear out mucus plugs.  MD also performed ultrasound of the lungs to look for effusion.  After bronch, patient's trach noted to be leaking.  MD pushed trach in slightly which seemed to help.  MD decreased patient's respiratory rate due to auto-peep (will obtain a follow up ABG per protocol).  Patient still noted to be using accessory muscles at this time.  Will continue to monitor.     Ref. Range 02/03/2018 16:05  Sample type Unknown ARTERIAL  pH, Arterial Latest Ref Range: 7.350 - 7.450  7.239 (L)  pCO2 arterial Latest Ref Range: 32.0 - 48.0 mmHg 88.3 (HH)  pO2, Arterial Latest Ref Range: 83.0 - 108.0 mmHg 96.0  TCO2 Latest Ref Range: 22 - 32 mmol/L 40 (H)  Acid-Base Excess Latest Ref Range: 0.0 - 2.0 mmol/L 9.0 (H)  Bicarbonate Latest Ref Range: 20.0 - 28.0 mmol/L 37.7 (H)  O2 Saturation Latest Units: % 95.0  Patient temperature Unknown 98.6 F  Collection site Unknown RADIAL, ALLEN'S TEST ACCEPTABLE

## 2018-02-03 NOTE — Progress Notes (Signed)
Patient noted to be breathing irregular with use of accessory muscles.  Attempted to suction patient and noted that patient had a moderate amount of blood being suctioned from patient.  Noted to not have any air movement on the left side of patient.  NP, Canary Brim, came to patient bedside.  STAT chest xray ordered.  Patient stating he is having difficulty breathing.  Will continue to monitor.

## 2018-02-03 NOTE — Progress Notes (Signed)
Pharmacy Antibiotic Note  Malik Brooks is a 63 y.o. male admitted from [facility] on 02-22-18 s/p cardiac arrest. All cultures are no growth so far. Additional respiratory cultures were taken during bronch today. Antibiotics were discontinued yesterday. However, today patient began having increased work of breathing and required a bronchoscopy; therefore, antibiotics will be resumed.  Plan: -Vancomycin 1250mg  IV q12h (estimated AUC 503, goal 400-550) -Meropenem 2g/8h -F/u clinical status, C&S, renal function, de-escalation, LOT, vancomycin levels as appropriate  Height: 5' 8.5" (174 cm) Weight: 283 lb 1.1 oz (128.4 kg) IBW/kg (Calculated) : 69.55  Temp (24hrs), Avg:98.2 F (36.8 C), Min:97.3 F (36.3 C), Max:99.1 F (37.3 C)  Recent Labs  Lab 02-22-18 1543  02/22/2018 2026 01/30/18 0503 01/31/18 0348 02/01/18 0420 02/02/18 0454 02/03/18 0521  WBC  --   --  27.1* 16.3* 11.0*  --  10.9* 8.5  CREATININE  --    < > 0.51* 0.45* 0.53* 0.48* 0.36* 0.42*  LATICACIDVEN 6.25*  --  0.9  --   --   --   --   --    < > = values in this interval not displayed.      Antibiotics this admission Vanc 1/15 >>1/19; 1/20 >> Cefepime 1/15 >> 1/19 Flagyl 1/15 >> 1/16 Meropenem 1/20 >>  Microbiology 1/15 UCx - insignificant growth 1/15 BCx - NG x2 1/15 TA - normal flora 1/20 resp cx:   Malik Brooks 02/03/2018 5:48 PM

## 2018-02-03 NOTE — Care Management Note (Signed)
Case Management Note Malik Conradi, RN MSN CCM Transition of Care 2M [place] [phone]  Patient Details  Name: Malik Brooks MRN: [id number] Date of Birth: [birth year]  Subjective/Objective:           Cardiac arrest         Action/Plan: PTA [facility]. Paraplegic. Trach-vent dependent. Colostomy. Peg. Multiple pressure injuries. Received call from MD to look at options for transition home. Verified with [facility] LTACH-no LTACH benefits at this time. Discussed with CSW vent snf options. Spoke with patient's representative, Malik Brooks (niece)-[phone]. Malik Brooks stated that they have been working with Malik Brooks at [place] [phone] to get patient set up to be cared for at home-Malik Brooks stated that because they [place] in a [place] area that it has been difficult to find caregivers. Malik Brooks stated that she wanted to get patient home even if with hospice care-discussed that the vent disqualifies patient from hospice care. Discussed option of vent snf and possibility of being placed out of state. Malik Brooks verbalized understanding of limited placement options. Discussed home vents with Malik Brooks-[facility]. DME order placed for home vent assessment. Will continue to follow for transition of care needs.   Expected Discharge Date:                  Expected Discharge Plan:  Skilled Nursing Facility  In-House Referral:  Clinical Social Work  Discharge planning Services  CM Consult  Post Acute Care Choice:  Home Health Choice offered to:  [facility] POA / Guardian  DME Arranged:    DME Agency:     HH Arranged:    HH Agency:     Status of Service:  In process, will continue to follow  If discussed at Long Length of Stay Meetings, dates discussed:    Additional Comments: 02/03/2018-spoke with Malik Brooks at [facility] who stated that patient qualifies for a private duty nursing (PDN) with Medicaid for patient who are chronically trach-vent. Stated that this benefit provides 16 hours of nursing care 7 days a week.  However, when this process started the caregivers were available, however, now caregivers are not available in the area needed. A home assessment has been arranged by [facility] to do a home safety evaluation 1/21 and Respiratory Therapist will provide vent training to the family at the hospital this week. Spoke with Malik Brooks about progress toward disposition. Palliative consult pending for GOC, code status. Will continue to follow for transition of care needs.   Malik Kinds, RN 02/03/2018, 2:00 PM
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
## 2018-02-04 ENCOUNTER — Inpatient Hospital Stay (HOSPITAL_COMMUNITY): Payer: Medicare Other

## 2018-02-04 DIAGNOSIS — J9601 Acute respiratory failure with hypoxia: Secondary | ICD-10-CM | POA: Diagnosis not present

## 2018-02-04 DIAGNOSIS — T17998A Other foreign object in respiratory tract, part unspecified causing other injury, initial encounter: Secondary | ICD-10-CM | POA: Diagnosis not present

## 2018-02-04 DIAGNOSIS — I469 Cardiac arrest, cause unspecified: Secondary | ICD-10-CM | POA: Diagnosis not present

## 2018-02-04 LAB — BASIC METABOLIC PANEL
ANION GAP: 5 (ref 5–15)
BUN: 37 mg/dL — ABNORMAL HIGH (ref 8–23)
CO2: 32 mmol/L (ref 22–32)
Calcium: 8.8 mg/dL — ABNORMAL LOW (ref 8.9–10.3)
Chloride: 111 mmol/L (ref 98–111)
Creatinine, Ser: 0.42 mg/dL — ABNORMAL LOW (ref 0.61–1.24)
GFR calc Af Amer: >60 mL/min (ref >60)
GFR calc non Af Amer: >60 mL/min (ref >60)
Glucose, Bld: 148 mg/dL — ABNORMAL HIGH (ref 70–99)
Potassium: 3.7 mmol/L (ref 3.5–5.1)
Sodium: 148 mmol/L — ABNORMAL HIGH (ref 135–145)

## 2018-02-04 LAB — TYPE AND SCREEN
ABO/RH(D): O POS
Antibody Screen: NEGATIVE
Unit division: 0

## 2018-02-04 LAB — BPAM RBC
Blood Product Expiration Date: 202002182359
ISSUE DATE / TIME: 202001201354
Unit Type and Rh: 5100

## 2018-02-04 LAB — CBC
HCT: 27.4 % — ABNORMAL LOW (ref 39.0–52.0)
Hemoglobin: 7.7 g/dL — ABNORMAL LOW (ref 13.0–17.0)
MCH: 28 pg (ref 26.0–34.0)
MCHC: 28.1 g/dL — ABNORMAL LOW (ref 30.0–36.0)
MCV: 99.6 fL (ref 80.0–100.0)
Platelets: 171 10*3/uL (ref 150–400)
RBC: 2.75 MIL/uL — ABNORMAL LOW (ref 4.22–5.81)
RDW: 20.4 % — ABNORMAL HIGH (ref 11.5–15.5)
WBC: 10.4 10*3/uL (ref 4.0–10.5)
nRBC: 0 % (ref 0.0–0.2)

## 2018-02-04 LAB — GLUCOSE, CAPILLARY
Glucose-Capillary: 150 mg/dL — ABNORMAL HIGH (ref 70–99)
Glucose-Capillary: 116 mg/dL — ABNORMAL HIGH (ref 70–99)
Glucose-Capillary: 148 mg/dL — ABNORMAL HIGH (ref 70–99)
Glucose-Capillary: 192 mg/dL — ABNORMAL HIGH (ref 70–99)
Glucose-Capillary: 153 mg/dL — ABNORMAL HIGH (ref 70–99)

## 2018-02-04 MED ORDER — ESCITALOPRAM OXALATE 10 MG PO TABS
10.0000 mg | ORAL_TABLET | Freq: Every day | ORAL | Status: DC
  Administered 2018-02-04 – 2018-02-07 (×4): 10 mg
  Filled 2018-02-04 (×4): qty 1

## 2018-02-04 MED ORDER — MIDAZOLAM HCL 2 MG/2ML IJ SOLN
1.0000 mg | Freq: Once | INTRAMUSCULAR | Status: AC
  Administered 2018-02-04: 1 mg via INTRAVENOUS

## 2018-02-04 MED ORDER — GUAIFENESIN 100 MG/5ML PO SOLN
300.0000 mg | Freq: Four times a day (QID) | ORAL | Status: DC
  Administered 2018-02-04 – 2018-02-09 (×24): 300 mg
  Filled 2018-02-04 (×24): qty 15

## 2018-02-04 MED ORDER — SODIUM CHLORIDE 3 % IN NEBU
INHALATION_SOLUTION | RESPIRATORY_TRACT | Status: AC
  Filled 2018-02-04: qty 4

## 2018-02-04 MED ORDER — MIDAZOLAM HCL 2 MG/2ML IJ SOLN
INTRAMUSCULAR | Status: AC
  Filled 2018-02-04: qty 2

## 2018-02-04 MED ORDER — FREE WATER
200.0000 mL | Freq: Four times a day (QID) | Status: DC
  Administered 2018-02-05 (×3): 200 mL

## 2018-02-04 MED ORDER — MIDODRINE HCL 5 MG PO TABS
5.0000 mg | ORAL_TABLET | Freq: Three times a day (TID) | ORAL | Status: DC
  Administered 2018-02-04 – 2018-02-07 (×8): 5 mg via ORAL
  Filled 2018-02-04 (×9): qty 1

## 2018-02-04 MED ORDER — SODIUM CHLORIDE 3 % IN NEBU
4.0000 mL | INHALATION_SOLUTION | Freq: Three times a day (TID) | RESPIRATORY_TRACT | Status: AC
  Administered 2018-02-04 – 2018-02-07 (×8): 4 mL via RESPIRATORY_TRACT
  Filled 2018-02-04 (×7): qty 4

## 2018-02-04 NOTE — Care Management Note (Signed)
Case Management Note Hortencia ConradiWendi Jorryn Casagrande, RN MSN CCM Transition of Care [place] [phone]  Patient Details  Name: Malik Brooks MRN: [id number] Date of Birth: 12/30/1955  Subjective/Objective:           Cardiac arrest         Action/Plan: PTA [facility]. Paraplegic. Trach-vent dependent. Colostomy. Peg. Multiple pressure injuries. Received call from MD to look at options for transition home. Verified with [facility] LTACH-no LTACH benefits at this time. Discussed with CSW vent snf options. Spoke with patient's representative, Malik BrazilRoxanne Brooks ([phone]. Malik stated that they have been working with [facility] [phone]) to get patient set up to be cared for at home-Malik stated that because they live in a remote area that it has been difficult to find caregivers. Malik stated that she wanted to get patient home even if with hospice care-discussed that the vent disqualifies patient from hospice care. Discussed option of vent snf and possibility of being placed out of state. Malik verbalized understanding of limited placement options. Discussed home vents with Malik Brooks-[facility]. DME order placed for home vent assessment. Will continue to follow for transition of care needs.   Expected Discharge Date:                  Expected Discharge Plan:  Skilled Nursing Facility  In-House Referral:  Clinical Social Work  Discharge planning Services  CM Consult  Post Acute Care Choice:  [facility] Health [facility]  DME Arranged:    DME Agency:     HH Arranged:    HH Agency:     Status of Service:  In process, will continue to follow  If discussed at Long Length of Stay Meetings, dates discussed:    Additional Comments: 02/04/2018-[facility] Family had to cancel the home safety evaluation scheduled for today. Will continue to follow for transition of care needs.   02/03/2018-[facility] who stated that patient  qualifies for a private duty nursing (PDN) with Medicaid for patient who are chronically trach-vent. Stated that this benefit provides 16 hours of nursing care 7 days a week. However, when this process started the caregivers were available, however, now caregivers are not available in the area needed. A home assessment has been arranged by [facility] to do a home safety evaluation 1/21 and Respiratory Therapist will provide vent training to the family at the hospital this week. Spoke with Malik Brooks about progress toward disposition. Palliative consult pending for GOC, code status. Will continue to follow for transition of care needs.   Malik KindsWendi B Laksh Hinners, RN 02/04/2018, 4:32 PM

## 2018-02-04 NOTE — Progress Notes (Signed)
I discussed at length with patient's nephew the current situation with discharge planning and where things were left when he was transferred here from [facility]. The nephew tells me that there are 12 family members committed to his care, that his house is already set up with a vent and been through the necessary home inspection and they approved for 12 hours of in home skilled nursing each day, provided by [facility]. I again asked about code status and he says they will make that decision as a family together with the patient involved-they know the risk involved with moving him home and I discussed transition to comfort care when things deteriorate- the nephew says even if it the patient's dying wish to go home they want to make that happen even if that means continued resuscitation here in the hospital.  His goal is to continue any and all aggressive interventions to try to make that happen. Very difficult communicating with the patient and understanding his preferences for care other than he strongly endorses wanting to go home- but then shows distress when I tell him that he may not survive there for very long. Capacity for complex decision making is difficult to assess in his current condition.  Will continue to check in an see how Palliative Care can support this patient and his family.He is from [place], [place]- will ask care management to look into any palliative care resources (doubtful they exist) there to assist and follow this patient if he is able to leave the hospital.  Anderson Malta, DO Palliative Medicine [phone]

## 2018-02-04 NOTE — Progress Notes (Signed)
RT has been having issues with patient's trach constantly becoming loose from the connection to the trach.  RT had attempted several different ways to keep it secured.  Contacted Anders Simmonds, NP with [facility] to come and assess trach.  This RT assisted Anders Simmonds, NP with changing of trach due to no other way to keep it connected.  Patient tolerated well.  Positive color change was noted.  Bilateral breath sounds auscultated.  Chest xray ordered.  Will continue to monitor.

## 2018-02-04 NOTE — Progress Notes (Signed)
RT called to the bedside d/t pt.'s work of breathing and increased lethargy. RT attempted to suction patient and bag lavage, however pt. Started desating more and more until he was in the 26s and pt. HR dropped into the 30s. MD called and came to bedside. Pt. was already placed back on the vent and had started to, very slowly, increase his O2 sats. Pt. very minimally responsive at this time. Pt.'s niece, Leandro Reasoner, called and updated.

## 2018-02-04 NOTE — Progress Notes (Signed)
RT NOTE:  RT called due to patient SpO2 dropping to 70's. Pt being manually bagged when RT arrive. As reported by dayshift RT patient did not tolerate manual ventilation. RT put patient back on vent and did recruitment x 2 minutes. Pt SpO2 increased to 95%. RT lavaged patient x2 with moderate, thick yellow plugs suctioned. MD @ bedside ordered PEEP increase to 14 and hypertonic 3% now. Pt more comfortable and SpO2 100%.
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
## 2018-02-04 NOTE — Procedures (Signed)
Tracheostomy tube change: Informed verbal consent was obtained after explaining the risks (including bleeding and infection), benefits and alternatives of the procedure. Verbal timeout was performed prior to the procedure. The old  Adjustable length Portex cuffed size 7  trach was carefully removed. the tracheostomy site appeared: unremarkable . A new identical size 7 was  easily placed in the tracheostomy stoma and secured with velcro trach ties. The tracheostomy was patent, good color change observed via EZ-CAP, and the patient was easily able to voice with finger occlusion and tolerated the procedure well with no immediate complications.   -of note the distal most tip of trach at the point at the ETT interface connection point had worked loose and kept coming undone resulting in disconnect. We will also go ahead and order up Bivona fixed length XLT cuffed and back up size 5 distal XLT.  Simonne Martinet ACNP-BC [facility] Pulmonary/Critical Care Pager # [phone] OR # [phone] if no answer

## 2018-02-04 NOTE — Progress Notes (Signed)
Pt c/o trouble breathing, labored breathing, breathing around trach intermittently. RN gave 100% O2 breaths on vent and suctioned patient, copious tan thick secretions. O2 sats then dropped to 88%, RN suctioned again and gave 100% O2 breaths again on the vent and called RT, pt O2 sats increased to 94%. Pt O2 sats then decreased further to 70s, RN manually bagged patient, but O2 sats continued to drop to the 50s. RT at bedside put pt back on vent and lavaged/suctioned. RT changed vent settings/breathing treatment given per verbal MD order. Pt recovered to O2 sats of 99-100% on 100% FIO2 and 14 PEEP. Will continue to monitor closely.

## 2018-02-05 ENCOUNTER — Inpatient Hospital Stay (HOSPITAL_COMMUNITY): Payer: Medicare Other

## 2018-02-05 DIAGNOSIS — J9601 Acute respiratory failure with hypoxia: Secondary | ICD-10-CM | POA: Diagnosis not present

## 2018-02-05 DIAGNOSIS — T17998A Other foreign object in respiratory tract, part unspecified causing other injury, initial encounter: Secondary | ICD-10-CM | POA: Diagnosis not present

## 2018-02-05 DIAGNOSIS — L89154 Pressure ulcer of sacral region, stage 4: Secondary | ICD-10-CM | POA: Diagnosis not present

## 2018-02-05 DIAGNOSIS — I469 Cardiac arrest, cause unspecified: Secondary | ICD-10-CM | POA: Diagnosis not present

## 2018-02-05 DIAGNOSIS — Z515 Encounter for palliative care: Secondary | ICD-10-CM | POA: Diagnosis not present

## 2018-02-05 DIAGNOSIS — Z66 Do not resuscitate: Secondary | ICD-10-CM | POA: Diagnosis not present

## 2018-02-05 LAB — COMPREHENSIVE METABOLIC PANEL
ALT: 24 U/L (ref 0–44)
AST: 17 U/L (ref 15–41)
Albumin: 2 g/dL — ABNORMAL LOW (ref 3.5–5.0)
Alkaline Phosphatase: 76 U/L (ref 38–126)
Anion gap: 7 (ref 5–15)
BUN: 38 mg/dL — AB (ref 8–23)
CO2: 32 mmol/L (ref 22–32)
Calcium: 9 mg/dL (ref 8.9–10.3)
Chloride: 111 mmol/L (ref 98–111)
Creatinine, Ser: 0.43 mg/dL — ABNORMAL LOW (ref 0.61–1.24)
GFR calc Af Amer: >60 mL/min (ref >60)
Glucose, Bld: 166 mg/dL — ABNORMAL HIGH (ref 70–99)
Potassium: 3.7 mmol/L (ref 3.5–5.1)
Sodium: 150 mmol/L — ABNORMAL HIGH (ref 135–145)
Total Bilirubin: 0.7 mg/dL (ref 0.3–1.2)
Total Protein: 5.6 g/dL — ABNORMAL LOW (ref 6.5–8.1)

## 2018-02-05 LAB — CBC WITH DIFFERENTIAL/PLATELET
Abs Immature Granulocytes: 0.04 10*3/uL (ref 0.00–0.07)
Basophils Absolute: 0 10*3/uL (ref 0.0–0.1)
Basophils Relative: 0 %
Eosinophils Absolute: 0.1 10*3/uL (ref 0.0–0.5)
Eosinophils Relative: 1 %
HCT: 28.1 % — ABNORMAL LOW (ref 39.0–52.0)
Hemoglobin: 7.8 g/dL — ABNORMAL LOW (ref 13.0–17.0)
Immature Granulocytes: 0 %
Lymphocytes Relative: 10 %
Lymphs Abs: 0.9 10*3/uL (ref 0.7–4.0)
MCH: 27.7 pg (ref 26.0–34.0)
MCHC: 27.8 g/dL — ABNORMAL LOW (ref 30.0–36.0)
MCV: 99.6 fL (ref 80.0–100.0)
MONO ABS: 0.5 10*3/uL (ref 0.1–1.0)
Monocytes Relative: 5 %
Neutro Abs: 7.6 10*3/uL (ref 1.7–7.7)
Neutrophils Relative %: 84 %
Platelets: 160 10*3/uL (ref 150–400)
RBC: 2.82 MIL/uL — ABNORMAL LOW (ref 4.22–5.81)
RDW: 20.2 % — ABNORMAL HIGH (ref 11.5–15.5)
WBC: 9 10*3/uL (ref 4.0–10.5)
nRBC: 0 % (ref 0.0–0.2)

## 2018-02-05 LAB — MAGNESIUM: Magnesium: 2.3 mg/dL (ref 1.7–2.4)

## 2018-02-05 LAB — VANCOMYCIN, TROUGH: Vancomycin Tr: 34 ug/mL (ref 15–20)

## 2018-02-05 LAB — GLUCOSE, CAPILLARY
Glucose-Capillary: 116 mg/dL — ABNORMAL HIGH (ref 70–99)
Glucose-Capillary: 118 mg/dL — ABNORMAL HIGH (ref 70–99)
Glucose-Capillary: 158 mg/dL — ABNORMAL HIGH (ref 70–99)
Glucose-Capillary: 210 mg/dL — ABNORMAL HIGH (ref 70–99)
Glucose-Capillary: 218 mg/dL — ABNORMAL HIGH (ref 70–99)

## 2018-02-05 LAB — TROPONIN I: Troponin I: <0.03 ng/mL (ref <0.03)

## 2018-02-05 MED ORDER — FREE WATER: 200.0000 mL | Freq: Once | Status: DC

## 2018-02-05 MED ORDER — ALTEPLASE 2 MG IJ SOLR
2.0000 mg | Freq: Once | INTRAMUSCULAR | Status: AC
  Administered 2018-02-05: 2 mg
  Filled 2018-02-05: qty 2

## 2018-02-05 MED ORDER — FREE WATER
200.0000 mL | Status: DC
  Administered 2018-02-05 – 2018-02-07 (×11): 200 mL

## 2018-02-05 MED ORDER — IPRATROPIUM-ALBUTEROL 0.5-2.5 (3) MG/3ML IN SOLN
3.0000 mL | RESPIRATORY_TRACT | Status: DC
  Administered 2018-02-05 – 2018-02-10 (×28): 3 mL via RESPIRATORY_TRACT
  Filled 2018-02-05 (×29): qty 3

## 2018-02-05 MED ORDER — VANCOMYCIN VARIABLE DOSE PER UNSTABLE RENAL FUNCTION (PHARMACIST DOSING): Status: DC

## 2018-02-05 NOTE — Progress Notes (Signed)
I have attempted to contact Roxanne his niece and primary contact multiple times today-calls go to unidentified voicemail. Patient's current status is very labile and he cannot reliably make decisions for himself- he is not stable for transport and would likely not survive that. Any additional attempts at resuscitation would be medically ineffective and cause suffering without a reversible condition present. Patient has expressed a desire to discontinue his current medical interventions- his main goal is for ice chips and to have the cords and wires disconnected from him. Periods of agitation and hyper alertness. Full vent support. No family at bedside today. I will continue to try to assist in determining reasonable goals of care. If family arrive this PM I have requested RN call me. Case dicussed with RN, CCM resident., all records reviewed.  Time: 35 min Greater than 50%  of this time was spent counseling and coordinating care related to the above assessment and plan.   Anderson Malta, DO Palliative Medicine [phone]

## 2018-02-05 NOTE — Progress Notes (Signed)
eLink Physician-Brief Progress Note Patient Name: Malik Brooks DOB: 1955/07/19 MRN: [id number]   Date of Service  02/05/2018  HPI/Events of Note  Tracheostomy cuff leak with ineffective ventilation.  eICU Interventions  Dr. Minette Headland requested to go to the room stat and assist with tracheostomy exchange which was successfully accomplished, + EtCo2, BBS=        Okoronkwo U Ogan 02/05/2018, 11:52 PM

## 2018-02-05 NOTE — Procedures (Signed)
Tracheostomy Change Note  Patient Details:   Name: Malik Brooks DOB: [birth year] MRN: [id number]    Airway Documentation: Patient had cuff leak and was unable to ventilate. Patient had a #7 portex which was replaced with a #8 Bivona. Patient remained stable during trach change. Positive color change and equal breath sounds. Dr. Minette Headland at the bedside.      Evaluation  O2 sats: stable throughout Complications: No apparent complications Patient did tolerate procedure well. Bilateral Breath Sounds: Diminished, Rhonchi    Leafy Half 02/05/2018, 11:58 PM

## 2018-02-05 NOTE — Progress Notes (Signed)
eLink Physician-Brief Progress Note Patient Name: Daniels Thoma DOB: January 06, 1956 MRN: [id number]   Date of Service  02/05/2018  HPI/Events of Note  Multiple issues: 1. Brief episodes of bradycardia. HR now = 84. NSR and 2. No AM labs ordered.   eICU Interventions  Will order: 1. CBC with platelets, CMP and Mg++ level in AM.      Intervention Category Major Interventions: Arrhythmia - evaluation and management  Sommer,Steven Eugene 02/05/2018, 3:28 AM

## 2018-02-05 NOTE — Progress Notes (Signed)
Pharmacy Antibiotic Note  Malik Brooks is a 63 y.o. male with  h/o trach-dependent VDRF and paragplegia   admitted from [facility] on 02/05/2018 s/p cardiac arrest.  Vancomycin started for possible HCAP.  Vancomycin trough this morning supratherapeutic   Plan: Hold Vancomycin for now Recheck level this evening and resume once within goal range  Height: 5' 8.5" (174 cm) Weight: 276 lb 7.3 oz (125.4 kg) IBW/kg (Calculated) : 69.55  Temp (24hrs), Avg:98.5 F (36.9 C), Min:96.3 F (35.7 C), Max:99.1 F (37.3 C)  Recent Labs  Lab February 05, 2018 1543  02/05/18 2026  01/31/18 0348 02/01/18 0420 02/02/18 0454 02/03/18 0521 02/04/18 1006 02/05/18 0437  WBC  --   --  27.1*   < > 11.0*  --  10.9* 8.5 10.4 9.0  CREATININE  --    < > 0.51*   < > 0.53* 0.48* 0.36* 0.42* 0.42* 0.43*  LATICACIDVEN 6.25*  --  0.9  --   --   --   --   --   --   --   VANCOTROUGH  --   --   --   --   --   --   --   --   --  34*   < > = values in this interval not displayed.      Antibiotics this admission Vanc 1/15 >>1/19; 1/20 >> Cefepime 1/15 >> 1/19 Flagyl 1/15 >> 1/16 Meropenem 1/20 >>  Microbiology 1/15 UCx - insignificant growth 1/15 BCx - NG x2 1/15 TA - normal flora 1/20 resp cx:   Eddie Candle 02/05/2018 5:45 AM

## 2018-02-05 NOTE — Progress Notes (Signed)
2 episodes of non-sustained bradycardia to 49 bpm. HR 70s-80s at this time. E link notified, will continue to monitor closely.

## 2018-02-05 NOTE — Progress Notes (Signed)
Per NT pt. pulled hand away and refused to have CBG checked.

## 2018-02-05 NOTE — Care Management Note (Addendum)
Case Management Note [name], RN MSN CCM Transition of Care 64M [place] [phone]  Patient Details  Name: Malik Brooks MRN: [id number] Date of Birth: 04-13-1955  Subjective/Objective:           Cardiac arrest         Action/Plan: PTA [facility]. Paraplegic. Trach-vent dependent. Colostomy. Peg. Multiple pressure injuries. Received call from MD to look at options for transition home. Verified with [facility] LTACH-no LTACH benefits at this time. Discussed with CSW vent snf options. Spoke with patient's representative, [name] (niece)-[phone]. Roxanne stated that they have been working with [name] at [facility] [phone]) to get patient set up to be cared for at home-Roxanne stated that because they live in a remote area that it has been difficult to find caregivers. Roxanne stated that she wanted to get patient home even if with hospice care-discussed that the vent disqualifies patient from hospice care. Discussed option of vent snf and possibility of being placed out of state. Roxanne verbalized understanding of limited placement options. Discussed home vents with [name]. DME order placed for home vent assessment. Will continue to follow for transition of care needs.   Expected Discharge Date:                  Expected Discharge Plan:  Skilled Nursing Facility  In-House Referral:  Clinical Social Work  Discharge planning Services  CM Consult  Post Acute Care Choice:  Home Health Choice offered to:  [facility] POA / Guardian  DME Arranged:    DME Agency:     HH Arranged:    HH Agency:     Status of Service:  In process, will continue to follow  If discussed at Long Length of Stay Meetings, dates discussed:    Additional Comments: 02/05/2018-Noted Palliative note that nephew stating that home safety evaluation has been done and vent in place. Verified with [name] at [facility] home safety evaluation completed. Appointment for home evaluation was scheduled for 02/04/2018,  but was canceled by family yesterday. No vent is in the home. ICU RN provided information to palliative who will f/u with niece. Will continue to follow for transition of care needs.  02/04/2018-received call from Wilson at [facility]. Family had to cancel the home safety evaluation scheduled for today. Will continue to follow for transition of care needs.   02/03/2018-spoke with [name] at [facility] who stated that patient qualifies for a private duty nursing (PDN) with Medicaid for patient who are chronically trach-vent. Stated that this benefit provides 16 hours of nursing care 7 days a week. However, when this process started the caregivers were available, however, now caregivers are not available in the area needed. A home assessment has been arranged by [facility]) to do a home safety evaluation 1/21 and Respiratory Therapist will provide vent training to the family at the hospital this week. Spoke with Dr. Gwyneth Revels about progress toward disposition. Palliative consult pending for GOC, code status. Will continue to follow for transition of care needs.   Bess Kinds, RN 02/05/2018, 1:17 PM
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
## 2018-02-06 DIAGNOSIS — I469 Cardiac arrest, cause unspecified: Secondary | ICD-10-CM | POA: Diagnosis not present

## 2018-02-06 DIAGNOSIS — J9601 Acute respiratory failure with hypoxia: Secondary | ICD-10-CM | POA: Diagnosis not present

## 2018-02-06 LAB — BASIC METABOLIC PANEL
Anion gap: 4 — ABNORMAL LOW (ref 5–15)
BUN: 46 mg/dL — ABNORMAL HIGH (ref 8–23)
CO2: 35 mmol/L — ABNORMAL HIGH (ref 22–32)
Calcium: 8.9 mg/dL (ref 8.9–10.3)
Chloride: 110 mmol/L (ref 98–111)
Creatinine, Ser: 0.39 mg/dL — ABNORMAL LOW (ref 0.61–1.24)
GFR calc Af Amer: >60 mL/min (ref >60)
GFR calc non Af Amer: >60 mL/min (ref >60)
Glucose, Bld: 185 mg/dL — ABNORMAL HIGH (ref 70–99)
POTASSIUM: 4.1 mmol/L (ref 3.5–5.1)
Sodium: 149 mmol/L — ABNORMAL HIGH (ref 135–145)

## 2018-02-06 LAB — CBC
HCT: 27 % — ABNORMAL LOW (ref 39.0–52.0)
Hemoglobin: 7.5 g/dL — ABNORMAL LOW (ref 13.0–17.0)
MCH: 28.1 pg (ref 26.0–34.0)
MCHC: 27.8 g/dL — ABNORMAL LOW (ref 30.0–36.0)
MCV: 101.1 fL — ABNORMAL HIGH (ref 80.0–100.0)
NRBC: 0 % (ref 0.0–0.2)
Platelets: 164 10*3/uL (ref 150–400)
RBC: 2.67 MIL/uL — AB (ref 4.22–5.81)
RDW: 20.1 % — ABNORMAL HIGH (ref 11.5–15.5)
WBC: 9.3 10*3/uL (ref 4.0–10.5)

## 2018-02-06 LAB — GLUCOSE, CAPILLARY
Glucose-Capillary: 177 mg/dL — ABNORMAL HIGH (ref 70–99)
Glucose-Capillary: 200 mg/dL — ABNORMAL HIGH (ref 70–99)
Glucose-Capillary: 208 mg/dL — ABNORMAL HIGH (ref 70–99)
Glucose-Capillary: 209 mg/dL — ABNORMAL HIGH (ref 70–99)
Glucose-Capillary: 212 mg/dL — ABNORMAL HIGH (ref 70–99)
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
## 2018-02-06 NOTE — Progress Notes (Signed)
Chaplain came to meet family and patient and offer prayer.  Patient's health is declining-Had prayer for peace in heart and mind of patient and family and for staff as they care for Daevion and other patients.    02/06/18 1500  Clinical Encounter Type  Visited With Patient and family together  Visit Type Initial;Spiritual support  Referral From Family  Consult/Referral To Chaplain  Spiritual Encounters  Spiritual Needs Prayer  Stress Factors  Patient Stress Factors Health changes  Family Stress Factors Health changes   Phebe Colla, Chaplain

## 2018-02-06 NOTE — Progress Notes (Signed)
RT NOTE:  Pt has significant cuff leak. RT has attempted to put air in trach cuff multiple times. Vent continuously alarms low MVe. According to [place], RT patient has expressed that he does not want trach changed. He wants to discontinue vent and be left alone. Janina Mayo was left and not changed during dayshift. Nurse has contacted Pallative Care and they are waiting for family to call back concerning trach change. Vitals stable. Spo2 100%. RT attempted to switch patient to ATC until resolved, however, patient did not tolerate. RT will monitor.

## 2018-02-06 NOTE — Progress Notes (Addendum)
Pharmacy Antibiotic Note  Malik Brooks is a 63 y.o. male admitted from [facility] on 2018-02-15 s/p cardiac arrest. Resp cultures positive for E. Coli (susceptible to imipenem) and few pseudomonas (susceptibilities pending). Per [facility], treat for 7 days.   Afebrile; WBC WNL; Scr stable   Plan: - Continue Meropenem 2g IV q8h  - F/u clinical status, C&S, renal function  Height: 5' 8.5" (174 cm) Weight: 281 lb 4.9 oz (127.6 kg) IBW/kg (Calculated) : 69.55  Temp (24hrs), Avg:97.7 F (36.5 C), Min:95.9 F (35.5 C), Max:98.6 F (37 C)  Recent Labs  Lab 02/02/18 0454 02/03/18 0521 02/04/18 1006 02/05/18 0437 02/06/18 0701  WBC 10.9* 8.5 10.4 9.0 9.3  CREATININE 0.36* 0.42* 0.42* 0.43* 0.39*  VANCOTROUGH  --   --   --  34*  --       Antibiotics this admission Vanc 1/15 >>1/19; 1/20 >>1/22 Cefepime 1/15 >> 1/19 Flagyl 1/15 >> 1/16 Meropenem 1/20 >> (1/27)   Microbiology 1/15 UCx - insignificant growth 1/15 BCx - NG x2 1/15 TA - normal flora 1/20 resp cx: E. coli and few pseudomonas    Delight Hoh 02/06/2018 10:03 AM

## 2018-02-06 NOTE — Progress Notes (Signed)
RT NOTE:  RT called for trach cuff leak. Upon arrival patient was only returning Vt <279mls with audible cuff leak. Air added back to trach and leak resolved. RT will report to Lequita Halt, RT for report to MD today.

## 2018-02-06 NOTE — Progress Notes (Signed)
Met with Roxanne his POA in person today. This was in follow up to our conversation last PM.  Summary of Recommendations and Plan:  1. DNR 2. Do not further escalate and medical interventions 3. No Pressors  4. Liberal pain and anxiety control-comfort is the main goal 5. No terminal extubation at this point-they are hoping he will die without having to electively remove the ventilator but we agreed to address this each day and if he is clearly at EOL the vent will be removed. 6. Comfort feeding as tolerated- can have ice chips with no limit 7. Supportive Care Only- no abx or law draws  8. Allow for a natural death to occur as a result of his underlying illness and injuries while maintaining his ventilation support.  Will follow.  Lane Hacker, DO Palliative Medicine [phone]

## 2018-02-06 NOTE — Progress Notes (Signed)
Spoke with patient's niece Roxanne last PM. We discussed his terminal condition and the goals of continued aggressive medical interventions to sustain his life in his current state or even worse. His prognosis is very poor in terms of surviving this hospitalization. She tells me she understands this and plans on discussing with her family this morning and also coming to the hospital. They want desperately to get him home even if that means to die there shortly after arrival. I asked her to consider what that would take and how much that would mean to him if he wasn't aware of his surroundings. She is open to hospice.At the moment he is probably to unstable to transport on a vent even a short distance-but if family is adamant no hospital death then we may be able to negotiate a transport to a hospice facility where he could be removed and made comfortable. She was not willing to change his code status last PM until speaking with the family. The patient himself does not have full capacity for decision making and if refusing medications and care interventions.   Anderson Malta, DO Palliative Medicine [phone]

## 2018-02-07 DIAGNOSIS — J9601 Acute respiratory failure with hypoxia: Secondary | ICD-10-CM | POA: Diagnosis not present

## 2018-02-07 LAB — CULTURE, RESPIRATORY W GRAM STAIN

## 2018-02-07 LAB — GLUCOSE, CAPILLARY
GLUCOSE-CAPILLARY: 208 mg/dL — AB (ref 70–99)
Glucose-Capillary: 169 mg/dL — ABNORMAL HIGH (ref 70–99)
Glucose-Capillary: 184 mg/dL — ABNORMAL HIGH (ref 70–99)
Glucose-Capillary: 186 mg/dL — ABNORMAL HIGH (ref 70–99)
Glucose-Capillary: 196 mg/dL — ABNORMAL HIGH (ref 70–99)
Glucose-Capillary: 199 mg/dL — ABNORMAL HIGH (ref 70–99)

## 2018-02-07 MED ORDER — ONDANSETRON 4 MG PO TBDP: 4.0000 mg | ORAL_TABLET | Freq: Four times a day (QID) | ORAL | Status: DC | PRN

## 2018-02-07 MED ORDER — MORPHINE SULFATE (PF) 2 MG/ML IV SOLN
1.0000 mg | INTRAVENOUS | Status: DC | PRN
  Administered 2018-02-07: 1 mg via INTRAVENOUS
  Filled 2018-02-07: qty 1

## 2018-02-07 MED ORDER — ONDANSETRON HCL 4 MG/2ML IJ SOLN: 4.0000 mg | Freq: Four times a day (QID) | INTRAMUSCULAR | Status: DC | PRN

## 2018-02-07 MED ORDER — SCOPOLAMINE 1 MG/3DAYS TD PT72
1.0000 | MEDICATED_PATCH | TRANSDERMAL | Status: DC
  Administered 2018-02-07: 1.5 mg via TRANSDERMAL
  Filled 2018-02-07 (×2): qty 1

## 2018-02-07 MED ORDER — MORPHINE 100MG IN NS 100ML (1MG/ML) PREMIX INFUSION
2.0000 mg/h | INTRAVENOUS | Status: DC
  Administered 2018-02-07: 1 mg/h via INTRAVENOUS
  Filled 2018-02-07 (×3): qty 100

## 2018-02-07 NOTE — Progress Notes (Addendum)
Pt's family here to visit- asked about his morning stated they live 2 hours away and could not get here sooner - Explained to family that pt had ice chips frequently and 4 big sips of sprite today by 12 noon his respiratory rate was up to 26 and his oxygenation was not good so pt had 1 mg of Morphine  And later placed on a morphine gtt at 1mg /hr - now resting comfortably. Family says he does not have an allergy to Morphine but he has had it before and that he became" mean and a nasty asshole on Morphine" Call made to DR Phillips Odor and explained to family that if they are totally against morphine she could switch it to Fentanyl gtt but I asked them to see how comfortable he was VS - NSR 78 - 02 saturation 100 % BP 102/65 Resp rate 16-21  Family said that for now it was OK on Morphine gtt but that if he woke up more or got real agitated that they would want it changed - when RN left room family member taking screen shots on her phone of monitor screens

## 2018-02-07 NOTE — Progress Notes (Signed)
Nutrition Brief Note  Chart reviewed. Noted moving towards comfort measures, no escalation of care. Pt on morphine gtt.  Tube feeding discontinued by MD today.  No further nutrition interventions warranted at this time.  Please re-consult as needed.   Romelle Starcher MS, RD, LDN, CNSC [phone] Pager  [phone] Weekend/On-Call Pager

## 2018-02-07 NOTE — Progress Notes (Signed)
Pt asking for ice chips and small sips of clear soda - used diet sprite starting at 7:20 am - gave pt 3 cups of ice from 0720 to 11:45 asked pt to go slow 3 small sips of diet sprite Pt still trach to vent on 70% 02 at times pt's o2 sat dropped to 83 - suctioned rhonchi auscultated for  breath sounds HOB up 30 % - Pt refused turning or any personal hygiene- for  Mouth/oral  care shakes head back and forth. Dtoppedttube feed per DR order and free water for fear of aspiration.

## 2018-02-07 NOTE — Progress Notes (Signed)
Pt rr up to 26 - sats still in mid 80's increased work of breathing, suctioned, given 1 mg of morphine IV - morphine gtt ordered by MD will start at 1 mg/hr when gtt arrives
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
## 2018-02-08 DIAGNOSIS — J9622 Acute and chronic respiratory failure with hypercapnia: Secondary | ICD-10-CM | POA: Diagnosis not present

## 2018-02-08 LAB — GLUCOSE, CAPILLARY
Glucose-Capillary: 181 mg/dL — ABNORMAL HIGH (ref 70–99)
Glucose-Capillary: 167 mg/dL — ABNORMAL HIGH (ref 70–99)
Glucose-Capillary: 149 mg/dL — ABNORMAL HIGH (ref 70–99)
Glucose-Capillary: 128 mg/dL — ABNORMAL HIGH (ref 70–99)
Glucose-Capillary: 121 mg/dL — ABNORMAL HIGH (ref 70–99)
Glucose-Capillary: 123 mg/dL — ABNORMAL HIGH (ref 70–99)

## 2018-02-08 MED ORDER — MORPHINE BOLUS VIA INFUSION
1.0000 mg | INTRAVENOUS | Status: DC | PRN
  Administered 2018-02-08 – 2018-02-10 (×14): 1 mg via INTRAVENOUS
  Filled 2018-02-08: qty 1

## 2018-02-08 NOTE — Progress Notes (Signed)
Pt refusing most turns, education provided on importance of mobility in order to reduce pressure injury. Pt refusing full bath and linen changes as well as dressing changes. Education provided on the importance of changing dressings and the benefits of appropriate hygiene.  Pt refusing most oral care, education provided about the importance of appropriate oral hygiene, especially while on the vent.   Dr. Craige Cotta aware of pt's refusal.  Aloha Gell, RN

## 2018-02-08 NOTE — Progress Notes (Signed)
NAME:  Malik Brooks, MRN:  [id number], DOB:  [birth year], LOS: 10 ADMISSION DATE:  02/12/18, CONSULTATION DATE: 12-Feb-2018 REFERRING MD: Clarene Duke - EM  , CHIEF COMPLAINT:  Post-arrest  Brief History   This is a 63 year old male with a history significant for 5 cardiac arrests, spinal cord infarct with paraplegia, vent dependency with trach, colostomy and g-tube, DVT, COPD, HTN, CAD, and a fib who presented post-arrest. Coded for an estimate time of 31 minutes, had 8 epineprhine and 3 defibrillation.  Past Medical History  Cardiac Arrest  Spinal cord infarct Paraplegia  DVT DM HTN CAD COPD Ventilator dependency S/p trach, colostomy, g-tube placement   Significant Hospital Events   2018/02/12 > admitted to the ICU 1/19 >bronchoscopy 1/20 > bronchoscopy 1/21 > tracheostomy change 1/22 > tracheostomy change 1/24 > DNR, no escalation of care  Consults:  PCCM Palliative  Procedures:    Significant Diagnostic Tests:  Head CT 1/15>>> Small right occipital hematoma, old left frontal infarct, no acute findings  Micro Data:  UCx 1/15>>> less than 10,000 colonies/mL, insignificant cath BCx 1/15>>> NGTD x3 Tracheal aspirate 1/15>>> normal respiratory flora Tracheal aspirate 1/20 >> Few ESBL e coli, few pseudomonas  Antimicrobials:  Vanc 1/15>> 02/02/18 Cefepime 1/15>>  02/02/18 Flagyl>>  01/30/18 Meropenem > 1/23 Vancomycin > 1/22  Interim history/subjective:  Wants ice chips.    Objective   Blood pressure 111/73, pulse 91, temperature (!) 96.8 F (36 C), resp. rate (!) 26, height 5' 8.5" (1.74 m), weight 130.7 kg, SpO2 92 %.    Vent Mode: PRVC FiO2 (%):  [70 %-100 %] 70 % Set Rate:  [26 bmp] 26 bmp Vt Set:  [490 mL] 490 mL PEEP:  [14 cmH20] 14 cmH20 Plateau Pressure:  [29 cmH20-35 cmH20] 29 cmH20   Intake/Output Summary (Last 24 hours) at 02/08/2018 1335 Last data filed at 02/08/2018 1200 Gross per 24 hour  Intake 415.15 ml  Output 1925 ml  Net -1509.85 ml    Filed Weights   02/06/18 0500 02/07/18 0500 02/08/18 0500  Weight: 127.6 kg 132.6 kg 130.7 kg    Examination:  General - alert Eyes - pupils reactive ENT - trach site clean Cardiac - regular rate/rhythm, no murmur Chest - decreased BS Abdomen - soft, non tender, + bowel sounds Extremities - 1+ edema Skin - sacral ulcer Neuro - follows commands Resolved Hospital Problem list     Assessment & Plan:   Malik Brooks is a 63 year old male with a history significant for 5 cardiac arrests, spinal cord infarct with paraplegia, vent dependency with trach, colostomy and g-tube, DVT, COPD, HTN, CAD, and a fib who presented post-arrest. He has had poor improvement in his lung function while he has been here. Has undergone 2 bronchoscopies due to mucus plugging that showed diffuse mucus secretions. He has been having issues with hypotension that especially occurs at night and requires a norepinephrine drip to maintain a MAP of >65.   Patient has been having very little progress however today he appears to be much more comfortable after having his trach changed yesterday.   Palliative care has been following and has gotten in touch with Malik Brooks his POA. They have come to the conclusion that he will be DNR, we will not further escalate any medical interventions, no pressors, pain and anxiety control.   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   Anemia: On admission Hgb was 8.4, baseline of around 8.  - no further labs  DM: - not monitoring blood sugars anymore  Stage 4 Sacral decubitus ulcer:  - present prior to admission - Wound care -Oxycodone 5 mg q6hr PRN  GOC: - DNR, no escalation of care - palliative care consulted  Coralyn HellingVineet Ridhaan Dreibelbis,  MD [facility] 02/08/2018, 1:39 PM

## 2018-02-09 DIAGNOSIS — J9601 Acute respiratory failure with hypoxia: Secondary | ICD-10-CM | POA: Diagnosis not present

## 2018-02-09 LAB — GLUCOSE, CAPILLARY
GLUCOSE-CAPILLARY: 106 mg/dL — AB (ref 70–99)
Glucose-Capillary: 112 mg/dL — ABNORMAL HIGH (ref 70–99)
Glucose-Capillary: 123 mg/dL — ABNORMAL HIGH (ref 70–99)
Glucose-Capillary: 127 mg/dL — ABNORMAL HIGH (ref 70–99)
Glucose-Capillary: 140 mg/dL — ABNORMAL HIGH (ref 70–99)

## 2018-02-09 MED ORDER — PANTOPRAZOLE SODIUM 40 MG IV SOLR
40.0000 mg | INTRAVENOUS | Status: DC
  Administered 2018-02-09: 40 mg via INTRAVENOUS
  Filled 2018-02-09: qty 40

## 2018-02-09 NOTE — Progress Notes (Signed)
NAME:  Malik Brooks, MRN:  [id number], DOB:  11/11/1955, LOS: 11 ADMISSION DATE:  2018-03-08, CONSULTATION DATE: 2018-03-08 REFERRING MD: Clarene DukeLittle - EM  , CHIEF COMPLAINT:  Post-arrest  Brief History   This is a 63 year old male with a history significant for 5 cardiac arrests, spinal cord infarct with paraplegia, vent dependency with trach, colostomy and g-tube, DVT, COPD, HTN, CAD, and a fib who presented post-arrest. Coded for an estimate time of 31 minutes, had 8 epineprhine and 3 defibrillation. Discussed with attending and is now DNR.  However after discussion with family at the bedside on the morning of 01/30/18 and the improvement in his mental status they decided to make him full code again.   Past Medical History  Cardiac Arrest  Spinal cord infarct Paraplegia  DVT DM HTN CAD COPD Ventilator dependency S/p trach, colostomy, g-tube placement   Significant Hospital Events   Jan 14, 2019 > admitted to the ICU 1/19 >bronchoscopy 1/20 > bronchoscopy 1/21 > tracheostomy change 1/22 > tracheostomy change  Consults:  PCCM Palliative  Procedures:    Significant Diagnostic Tests:  Head CT 1/15>>> Small right occipital hematoma, old left frontal infarct, no acute findings  Micro Data:  UCx 1/15>>> less than 10,000 colonies/mL, insignificant cath BCx 1/15>>> NGTD x3 Tracheal aspirate 1/15>>> normal respiratory flora Tracheal aspirate 1/20 >> Few ESBL e coli, few pseudomonas  Antimicrobials:  Vanc 1/15>> 02/02/18 Cefepime 1/15>>  02/02/18 Flagyl>>  01/30/18 Meropenem > 1/23 Vancomycin > 1/22  Interim history/subjective:  Patient was noted to have a desaturation down to 70%, he was cleaned multiple times with some bloody secretions noted.  He was given breathing treatment and medications for pain and anxiety.  Patient saturations improved 100%.  No new complaints at this time.  No family at bedside   Objective   Blood pressure 119/84, pulse 70, temperature (!) 95.7 F  (35.4 C), resp. rate 19, height 5' 8.5" (1.74 m), weight 129.7 kg, SpO2 99 %.    Vent Mode: PRVC FiO2 (%):  [70 %-100 %] 90 % Set Rate:  [26 bmp] 26 bmp Vt Set:  [490 mL] 490 mL PEEP:  [14 cmH20] 14 cmH20 Plateau Pressure:  [29 cmH20-35 cmH20] 33 cmH20   Intake/Output Summary (Last 24 hours) at 02/09/2018 [id number] Last data filed at 02/09/2018 [id number] Gross per 24 hour  Intake 264.31 ml  Output 1115 ml  Net -850.69 ml   Filed Weights   02/07/18 0500 02/08/18 0500 02/09/18 0500  Weight: 132.6 kg 130.7 kg 129.7 kg    Examination: General: Chronically ill appearing male, NAD, trach midline and on ventilator, awake and interactive, follows commands  HENT: Normocephalic, atraumatic Lungs: Bilateral rhonchi, normal work of breathing, on ventilator Cardiovascular: RRR, no m/r/g, tender to palpation over substernal area Abdomen: Obese, soft, non-tender, +BS, ostomy and J-tube present Extremities: Trace BL LE edema, pulses 2+ and symmetric  Resolved Hospital Problem list     Assessment & Plan:   Mr. Laural BenesJohnson is a 63 year old male with a history significant for 5 cardiac arrests, spinal cord infarct with paraplegia, vent dependency with trach, colostomy and g-tube, DVT, COPD, HTN, CAD, and a fib who presented post-arrest. He has had poor improvement in his lung function while he has been here. Has undergone 2 bronchoscopies due to mucus plugging that showed diffuse mucus secretions. He has been having issues with hypotension that especially occurs at night and requires a norepinephrine drip to maintain a MAP of >65.   Patient has  been having very little progress however today he appears to be much more comfortable after having his trach changed yesterday.   Palliative care has been following and has gotten in touch with Roxanne his POA. They have come to the conclusion that he will be DNR, we will not further escalate any medical interventions, no pressors, pain and anxiety control.   Trach  dependent ventilatory failure with profound hypoxia: COPD: HFrEF: Pneumonia: S/p cardiac arrest: Paraplegic: Patient is on no pressors.  He has multiple episodes of desaturation which is likely related to the ice chips and soda he has been getting.  He has not been having much improvement and is currently only on supportive care.  The family had talked with palliative and had decided to make him DNR, do not further escalate medical interventions, no pressors, liberal pain and anxiety control, no terminal extubation at this point, comfort feeding, supportive care only.  At this time patient has been having multiple episodes of desaturations and becomes very agitated and uncomfortable.  We are trying to make him as comfortable as we are able to.  He is not on full comfort care at this point. -Appreciate palliative's assistance with this -Continue morphine drip with intermittent boluses -Continue oxycodone 5 mg every 4 PRN -Continue scopolamine patch -Continue Klonopin 0.5 3 times daily as needed -Continue Robitussin and duo nebs  Anemia: Leukocytosis: Hypernatremia: -No further lab draws -On admission Hgb was 8.4, baseline of around 8.  -Was transfused 1 unit on 02/03/18  DM: -No further lab draws  Sacral decubitus ulcer:  -Wound care -Oxycodone 5 mg q6hr PRN  GOC: Patient had been switched to DNR after palliative medicine and extensive discussion with power of attorney Roxanne.  Patient had started to anxiety did not want to continue the current management since yesterday, he expressed interest in just removing the trach.  We will continue his current management however we will not further escalate any treatment.  Palliative care is assisting with pain and anxiety control.  Patient can have ice chips with no limits.  Best practice:  Diet: Sips of soda, ice chips Pain/Anxiety/Delirium protocol (if indicated): Oxycodone 5 mg PRN VAP protocol (if indicated): Yes DVT prophylaxis:  SCDs GI prophylaxis: protonix Glucose control: none Mobility: bedrest Code Status: DNR Family Communication: Discussed with patient Disposition: ICU   Labs   CBC: Recent Labs  Lab 02/03/18 0521 02/03/18 1900 02/04/18 1006 02/05/18 0437 02/06/18 0701  WBC 8.5  --  10.4 9.0 9.3  NEUTROABS  --   --   --  7.6  --   HGB 6.6* 8.5* 7.7* 7.8* 7.5*  HCT 24.1* 30.6* 27.4* 28.1* 27.0*  MCV 100.0  --  99.6 99.6 101.1*  PLT 148*  --  171 160 164    Basic Metabolic Panel: Recent Labs  Lab 02/03/18 0521 02/04/18 1006 02/05/18 0437 02/06/18 0701  NA 144 148* 150* 149*  K 3.9 3.7 3.7 4.1  CL 106 111 111 110  CO2 32 32 32 35*  GLUCOSE 148* 148* 166* 185*  BUN 29* 37* 38* 46*  CREATININE 0.42* 0.42* 0.43* 0.39*  CALCIUM 8.3* 8.8* 9.0 8.9  MG  --   --  2.3  --    GFR: Estimated Creatinine Clearance: 123.5 mL/min (A) (by C-G formula based on SCr of 0.39 mg/dL (L)). Recent Labs  Lab 02/03/18 0521 02/04/18 1006 02/05/18 0437 02/06/18 0701  WBC 8.5 10.4 9.0 9.3    Liver Function Tests: Recent Labs  Lab 02/05/18  0437  AST 17  ALT 24  ALKPHOS 76  BILITOT 0.7  PROT 5.6*  ALBUMIN 2.0*   No results for input(s): LIPASE, AMYLASE in the last 168 hours. No results for input(s): AMMONIA in the last 168 hours.  ABG    Component Value Date/Time   PHART 7.239 (L) 02/03/2018 1605   PCO2ART 88.3 (HH) 02/03/2018 1605   PO2ART 96.0 02/03/2018 1605   HCO3 36.5 (H) 02/03/2018 1808   TCO2 39 (H) 02/03/2018 1808   O2SAT 78.0 02/03/2018 1808     Coagulation Profile: No results for input(s): INR, PROTIME in the last 168 hours.  Cardiac Enzymes: Recent Labs  Lab 02/05/18 0824  TROPONINI <0.03    HbA1C: Hgb A1c MFr Bld  Date/Time Value Ref Range Status  May 24, 2018 08:26 PM 6.1 (H) 4.8 - 5.6 % Final    Comment:    (NOTE) Pre diabetes:          5.7%-6.4% Diabetes:              >6.4% Glycemic control for   <7.0% adults with diabetes     CBG: Recent Labs  Lab  02/08/18 1242 02/08/18 1607 02/08/18 2002 02/09/18 0017 02/09/18 0418  GLUCAP 128* 121* 123* 140* 123*    Review of Systems:   Negative except per HPI  Past Medical History  He,  has no past medical history on file.   Surgical History     Social History      Family History   His family history is not on file.   Allergies No Known Allergies   Home Medications  Prior to Admission medications   Not on File         Claudean SeveranceMarissa M Cleave Ternes, M.D. PGY1 Pager [phone] 02/09/2018 6:58 AM

## 2018-02-09 NOTE — Progress Notes (Addendum)
2200 Patient's foley temp has been low since 1900, pt refusing to wear gown, RN to apply any blankets or turn the thermostat up. Patient stated he's "hot" and has fan blowing on him.   2330 Pt desaturating on 70% O2. Pt suctioned multiple times, O2 increased to 100%, bloody secretions noted, O2 sat not increasing. Respiratory called, breathing tx given per MAR, medicated for pain and anxiety. Pt still not recovering with full support, E- Link called. Suggested switching out the inner canula of trach. When RN attempted, no inner cannula in place. RN called respiratory about inner canula but uncertain if that brand had an inner canula. E-Link MD stated she would call MD to room. MD at bedside, stated there was no inner canula, now patient at 92% on 100% O2, received orders to leave O2 at 100% and attempt to wean down over night. Pt resting comfortably, NAD.   0330 Patient again refusing bath, but RN talked patient into getting clean linen and washed up. Wound care also performed to all wounds and dressings changed. Patient's scrotum was stuck to disposable pad. When RN removed pad, found bleeding spot on patient's scrotum. RN cleansed and applied barrier cream. Patient tolerated well, VSS.

## 2018-02-10 DIAGNOSIS — J9809 Other diseases of bronchus, not elsewhere classified: Secondary | ICD-10-CM

## 2018-02-10 LAB — GLUCOSE, CAPILLARY: Glucose-Capillary: 99 mg/dL (ref 70–99)

## 2018-02-10 MED ORDER — MORPHINE BOLUS VIA INFUSION
1.0000 mg | Freq: Once | INTRAVENOUS | Status: DC
  Filled 2018-02-10: qty 1

## 2018-02-15 NOTE — Progress Notes (Signed)
I was contacted by his niece Leandro Reasoner re: his morphine intolerance. Patient was placed on a morphine infusion by CCM, but appears to be tolerating it well. I spoke with RN and advised if he had an hallucinations, agitation or distress that this should be switched to a Fentanyl infusion with bolus dosing for comfort. Goals are for full comfort and to allow for a natural death to occur, but not terminal extubation at this point.  Anderson Malta, DO Palliative Medicine

## 2018-02-15 NOTE — Progress Notes (Signed)
Wasted 110mg  of morphine in Stericycle system in medication room with [facility]. Tamsen Meek, RN 02/14/2018 8:44 AM

## 2018-02-15 NOTE — Progress Notes (Signed)
Pt is refusing all baths and dressing changes at this time. He becomes agitated and tachypnic, as well as mouthing curse words and other verbal abuse around his tracheostomy when RN persists in asking about bathing.  Will continue to monitor and pass on to following shift.

## 2018-02-15 NOTE — Death Summary Note (Signed)
  Name: [name] MRN: [id number] DOB: 11-17-1955 63 y.o.  Date of Admission: 2018/02/19  3:19 PM Date of Discharge: 03/03/18 Attending Physician: Mechele Collin  Discharge Diagnosis: Active Problems:   Cardiac arrest [facility])   Respiratory failure (HCC)   Pressure injury of skin Paraplegia, vent dependency with trach  Cause of death: Cardiac arrest 2/2 mucus plugging Time of death: 03-03-18 0758  Disposition and follow-up:   [name] was discharged from [facility] in expired condition.    Hospital Course: This is a 62 year old male with a history of paraplegia with chronic vent/trach dependence, cardiac arrest x5, COPD, DVT, diabetes, hypertension, A. fib.  He was brought in from [facility] on 1/15 after suffering a cardiac arrest that required 30 minutes of CPR, epinephrine, defibrillation, amiodarone and atropine was given.  Rhythms recorded were V. fib in PEA.  ROSC was achieved.  On arrival patient was noted to have dilated pupils, decerebrate posturing, upward gaze and there was concern for anoxic brain injury.  CT head showed no acute findings. The next morning patient's mental status improved and he had been switched to full code at that time.  During his admission patient had developed multiple episodes of mucous plugging causing significant desaturations. He required 2 bronchoscopies for improvement in his O2 saturations. While he was here he was treated with antibiotics for a pneumonia, transfused 1 unit of pRBC for anemia, and provided wound care for his multiple wounds.  Patient has been on a Levophed drip and this was slowly weaned down.  He was started on multiple medications for his respiratory insufficiency however required very high ventilator settings and had multiple episodes of desaturations he was here.  Unfortunately patient made very poor progress he was admitted and there was nothing acute that could be treated.  Palliative was consulted to  discuss goals of care and after talking with the patient and the family they came to the conclusion to make him DNR, do not progress care, and pursue mostly comfort measures.  On the day of 03/03/2022 patient started having bradycardia and hypoxia down to 50 to 60%.  RT attempted to suction out mucus however his oxygenation did not improve.  Patient was given oxycodone, Klonopin, and morphine to keep him comfortable.  Bradycardia worsened down to the 20s and then eventually he developed asystole.  Patient passed away at 7:58 AM.  Family was notified.  Signed: Claudean Severance, MD 03/03/2018, 3:15 PM

## 2018-02-15 NOTE — Progress Notes (Signed)
Patient passed with myself and another nurse at his side. Niece called and on her way (lives 2hrs. Away). CCM resident notified. Tamsen Meek, RN 03/07/18 9:38 AM

## 2018-02-15 NOTE — Progress Notes (Signed)
Called family regarding change in patient's HR and respiratory status. Currently on 100% FIO2 trach vent and morphine drip. 2mg  bolus of morphine once ordered. Will continue to monitor closely. Tamsen Meek, RN Mar 02, 2018 7:43 AM

## 2018-02-15 DEATH — deceased

## 2019-06-16 DEATH — deceased

## 2019-06-30 IMAGING — DX DG CHEST 1V PORT
1 series · 1 of 1 positions shown · non-contrast
Comparison: 11/11/2017

CLINICAL DATA: Respiratory failure, shortness of breath

EXAM:
PORTABLE CHEST 1 VIEW

[chest]
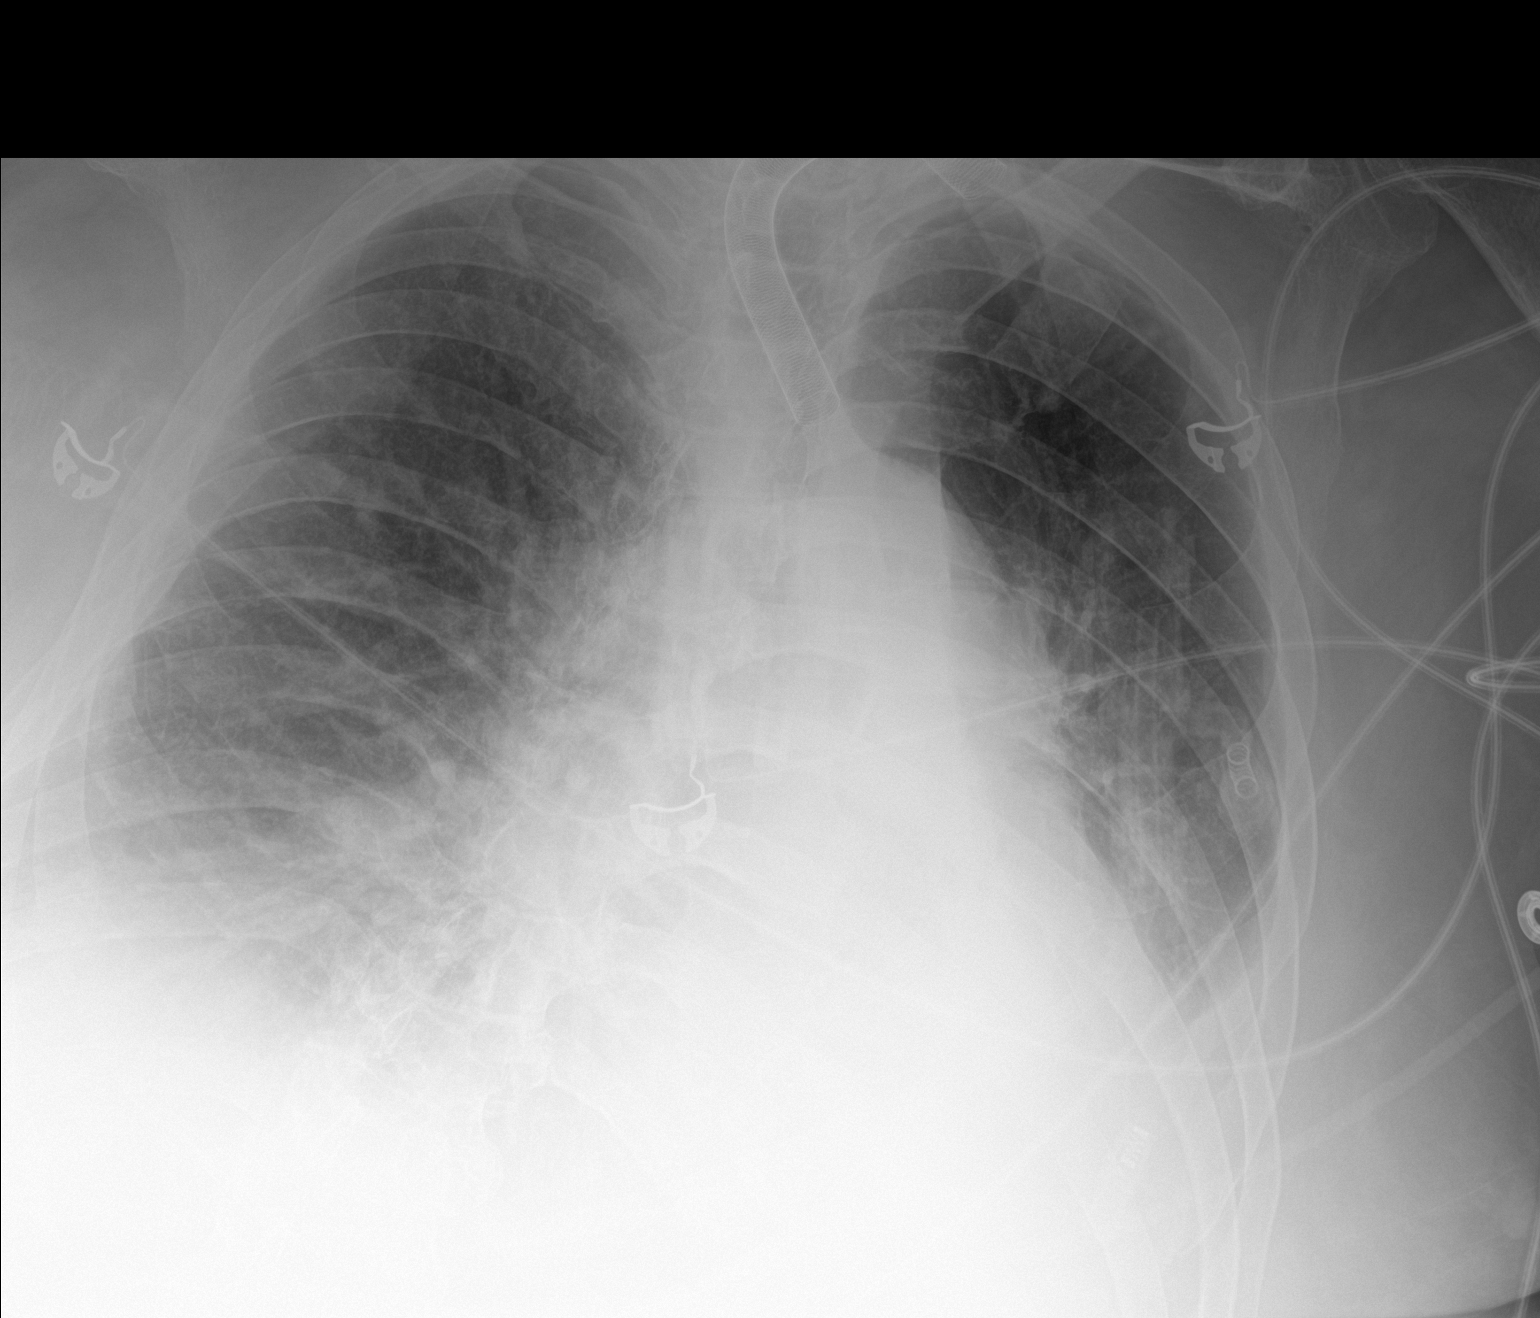

[1 of 1 positions shown; findings below may reference images not displayed]

FINDINGS: Tracheostomy is unchanged. Cardiomegaly with vascular congestion and
bilateral perihilar and lower lobe airspace opacities, likely edema.
Layering effusions with bibasilar atelectasis.
IMPRESSION: Cardiomegaly with vascular congestion and probable mild pulmonary
edema. Layering bilateral effusions with bibasilar atelectasis.

## 2019-09-16 IMAGING — CT HEAD^CT_ROUTINE_HEAD_WO (ADULT)
1 series · 1 of 1 positions shown · non-contrast
Comparison: None.

CLINICAL DATA: Cardiac arrest.

EXAM:
CT HEAD WITHOUT CONTRAST
TECHNIQUE: Contiguous axial images were obtained from the base of the skull
through the vertex without intravenous contrast.

[Series 2: topogram 0.6 tr20 · coronal · 1.00mm/px · 1 of 1 slices shown]
[im 1/1]
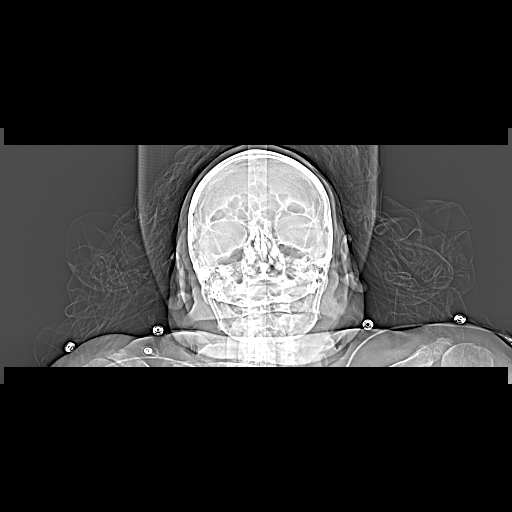

[1 of 1 positions shown; findings below may reference images not displayed]

FINDINGS: Brain: Mild left frontal encephalomalacia is noted consistent with
old infarction. No evidence of acute infarction, hemorrhage,
hydrocephalus, extra-axial collection or mass lesion/mass effect.

Vascular: No hyperdense vessel or unexpected calcification.

Skull: Normal. Negative for fracture or focal lesion.

Sinuses/Orbits: Right sphenoid sinusitis is noted.

Other: Small right occipital scalp hematoma is noted.
IMPRESSION: Small right occipital scalp hematoma. Probable old left frontal
infarction. No acute intracranial abnormality seen.

## 2019-09-18 IMAGING — DX DG CHEST PORT 1 VIEW
1 series · 2 of 2 positions shown · non-contrast
Comparison: 01/30/2018

CLINICAL DATA: Respiratory failure

EXAM:
PORTABLE CHEST 1 VIEW

[Series 1: chest · 0.14mm/px · 2 of 2 slices shown]
[im 1/2]
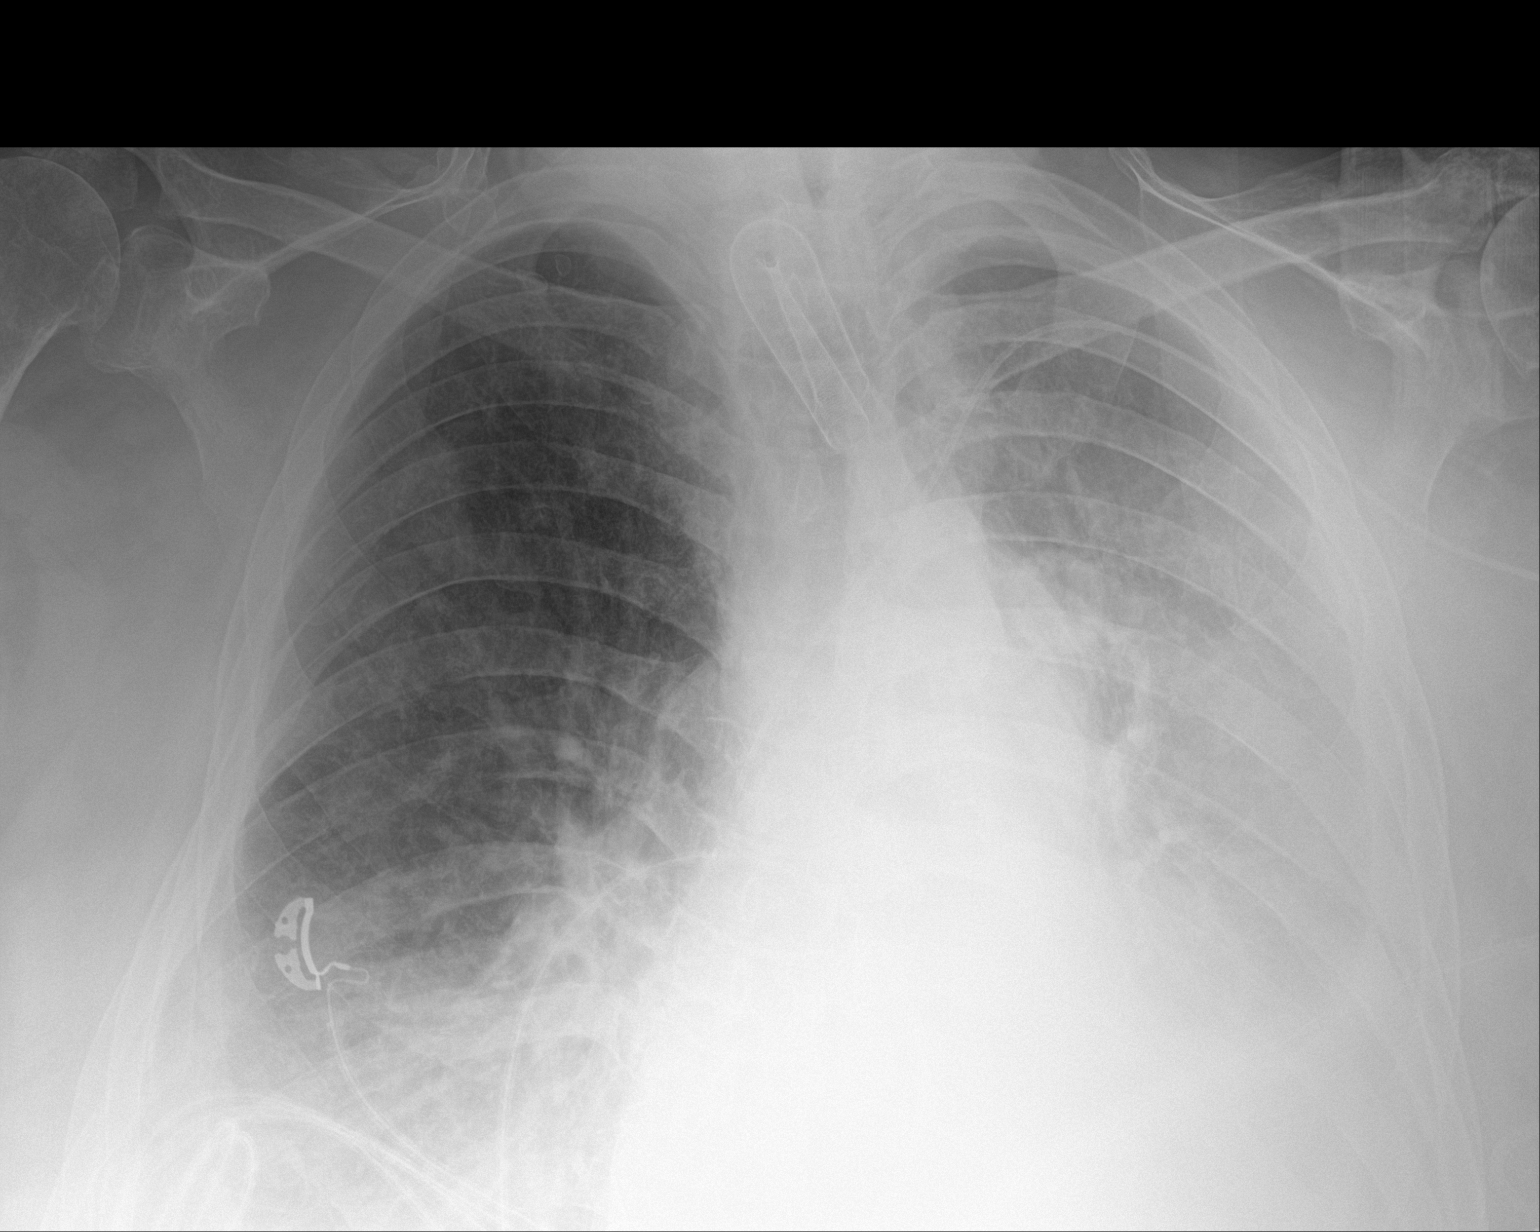
[im 2/2]
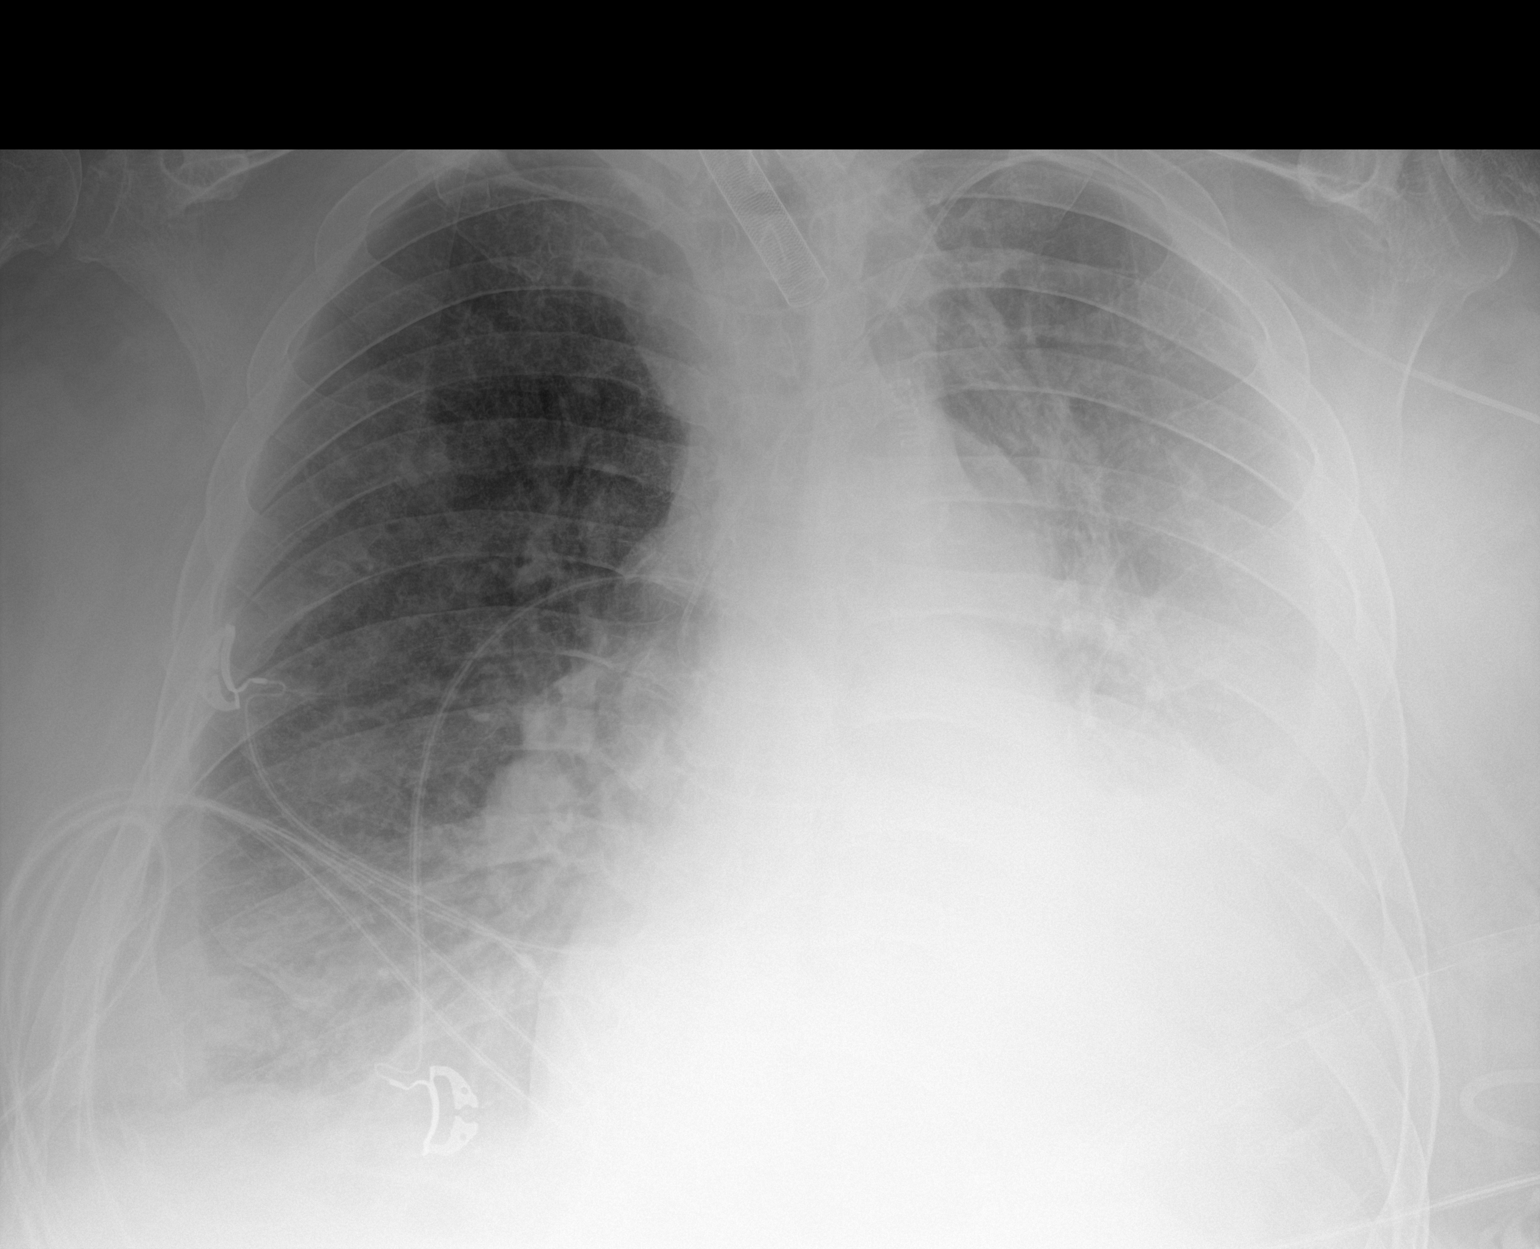

[2 of 2 positions shown; findings below may reference images not displayed]

FINDINGS: Tracheostomy tube is noted in satisfactory position. Left-sided PICC
line is noted in the distal superior vena cava. Left pleural
effusion is seen with suggestion of increase likely related to
patient positioning. Bibasilar infiltrates are again noted left
greater than right. No new focal abnormality is noted.
IMPRESSION: Suggestion of increasing left effusion although this may be
positional in nature. Persistent bibasilar infiltrates are noted.

## 2019-09-21 IMAGING — DX DG CHEST PORT 1 VIEW
1 series · 2 of 2 positions shown · non-contrast
Comparison: 02/03/2018

CLINICAL DATA: Shortness of breath

EXAM:
PORTABLE CHEST 1 VIEW

[Series 1: chest · 0.14mm/px · 2 of 2 slices shown]
[im 1/2]
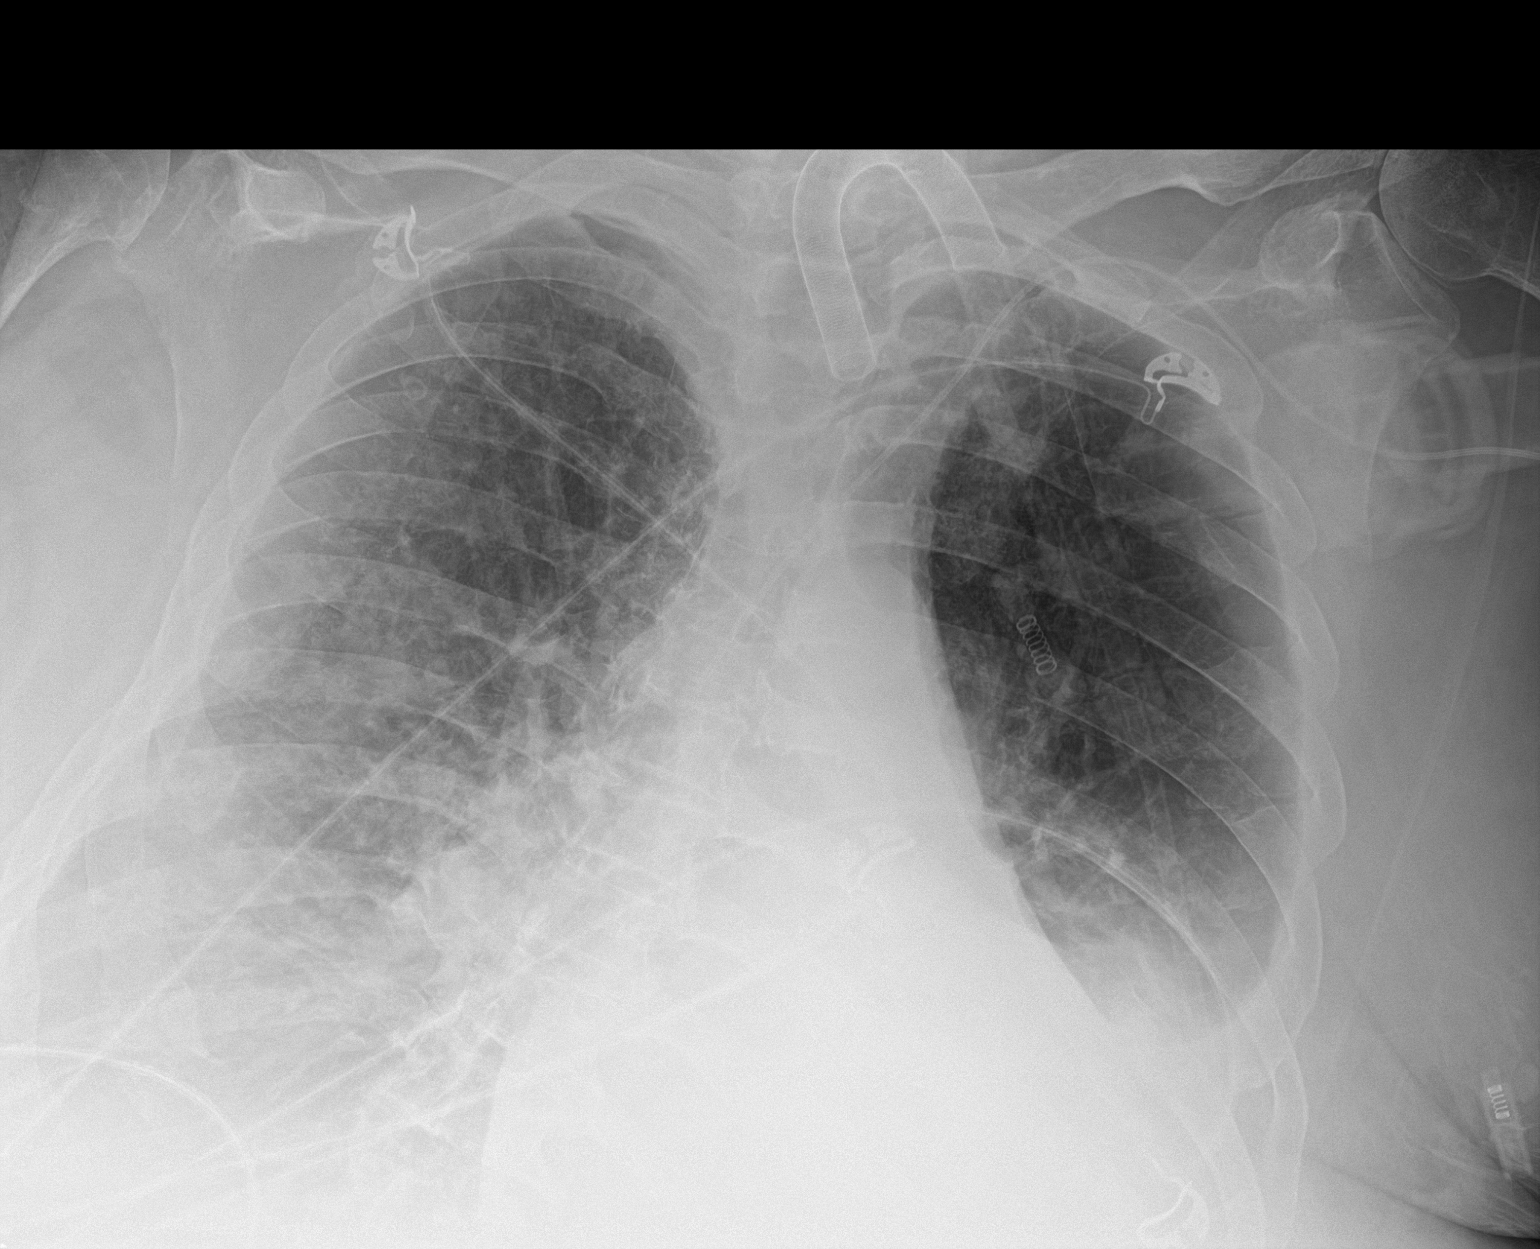
[im 2/2]
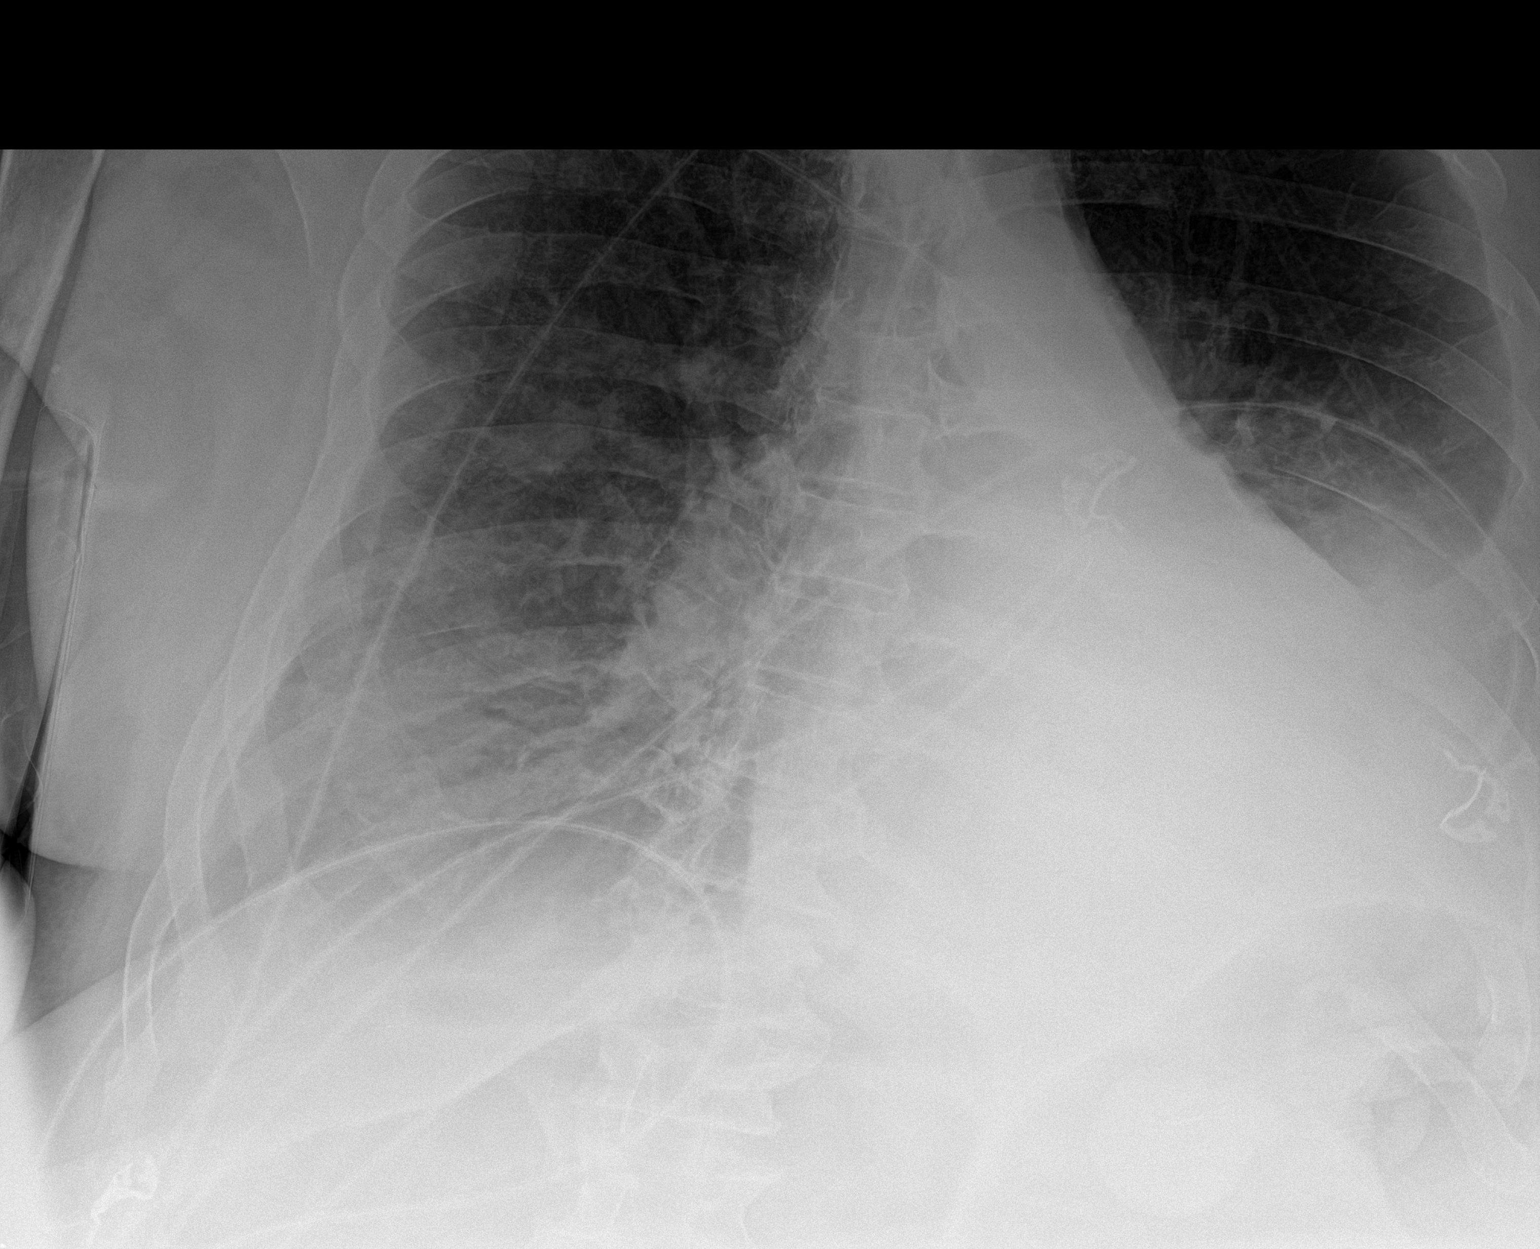

[2 of 2 positions shown; findings below may reference images not displayed]

FINDINGS: Stable tracheostomy overlying the upper airway.

Stable cardiomegaly with vascular congestion and diffusion
interstitial edema pattern. Pleural effusions present bilaterally.
No pneumothorax. No significant interval change. Left lower lobe
dense consolidation/collapse persists.

Left PICC line tip proximal SVC level.
IMPRESSION: Stable CHF pattern with cardiomegaly, interstitial edema and pleural
effusions

Dense left lower lobe collapse/consolidation

Stable support apparatus

## 2019-09-21 IMAGING — DX DG CHEST PORT 1 VIEW
1 series · 2 of 2 positions shown · non-contrast
Comparison: Portable exam 6455 hours compared to 1350 hours

CLINICAL DATA: Hemothorax LEFT

EXAM:
PORTABLE CHEST 1 VIEW

[Series 1: chest · 0.14mm/px · 2 of 2 slices shown]
[im 1/2]
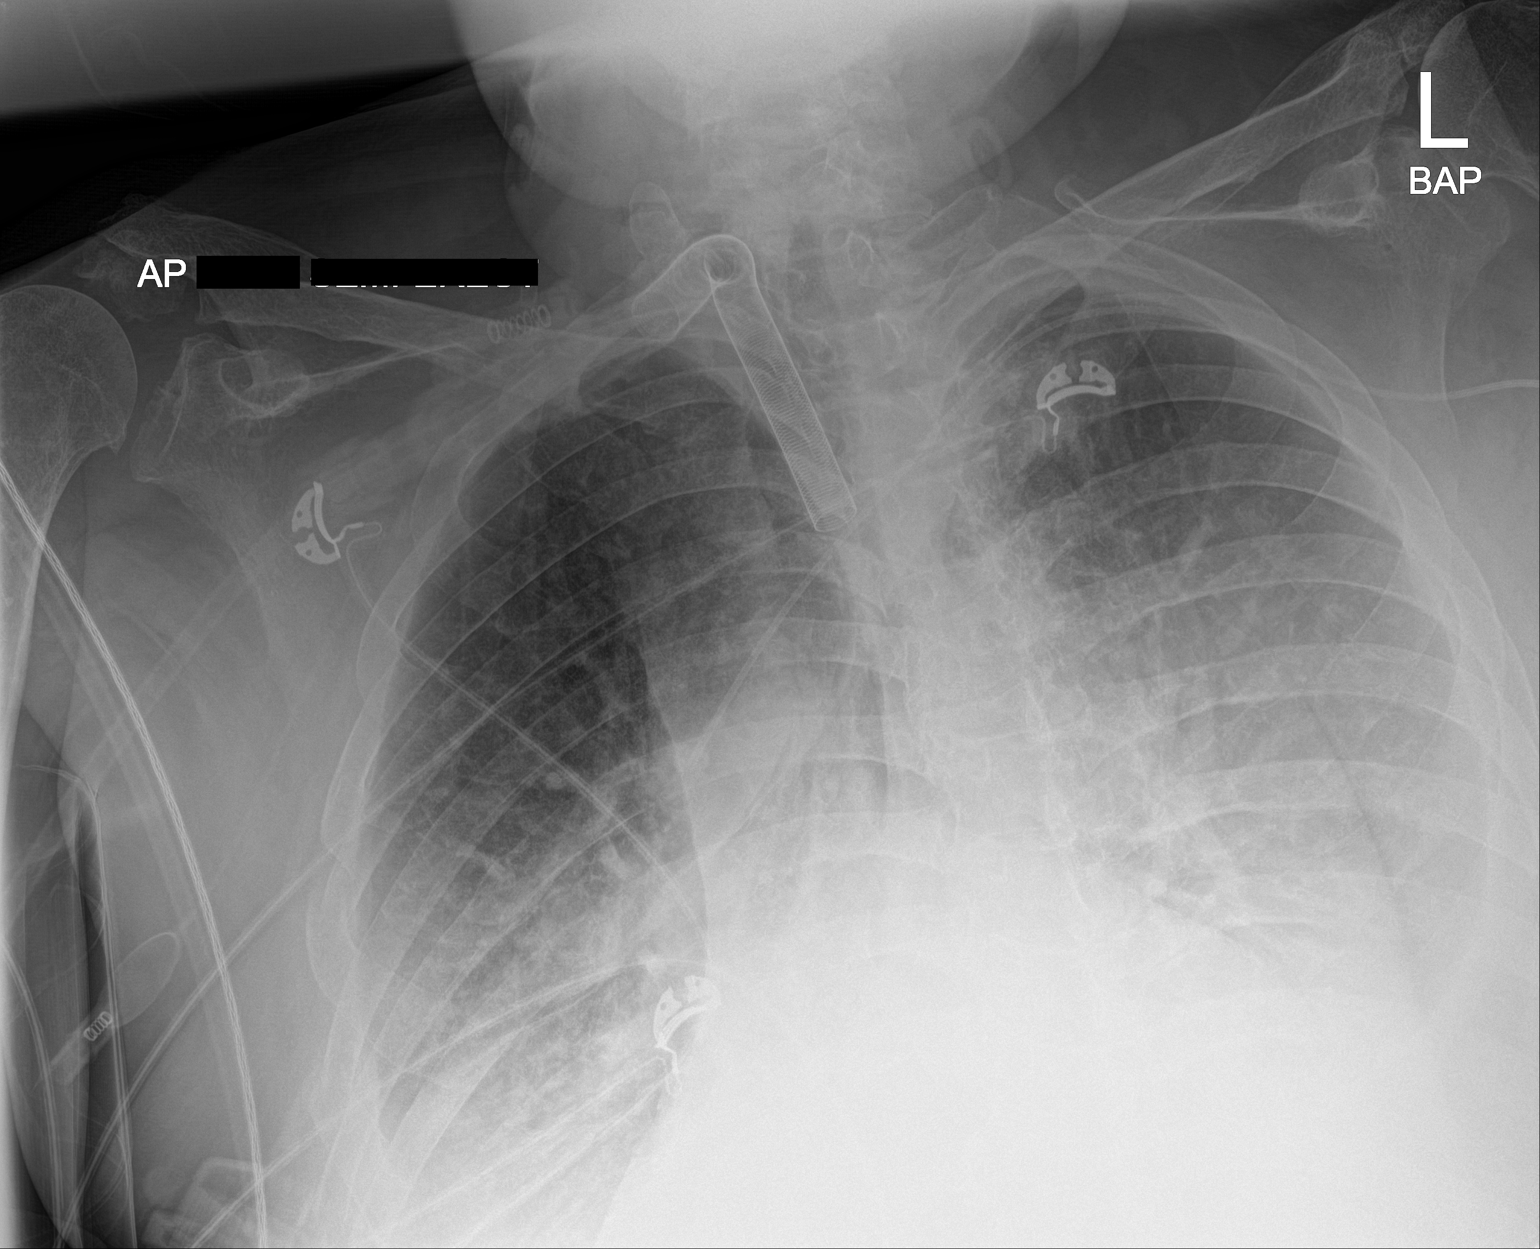
[im 2/2]
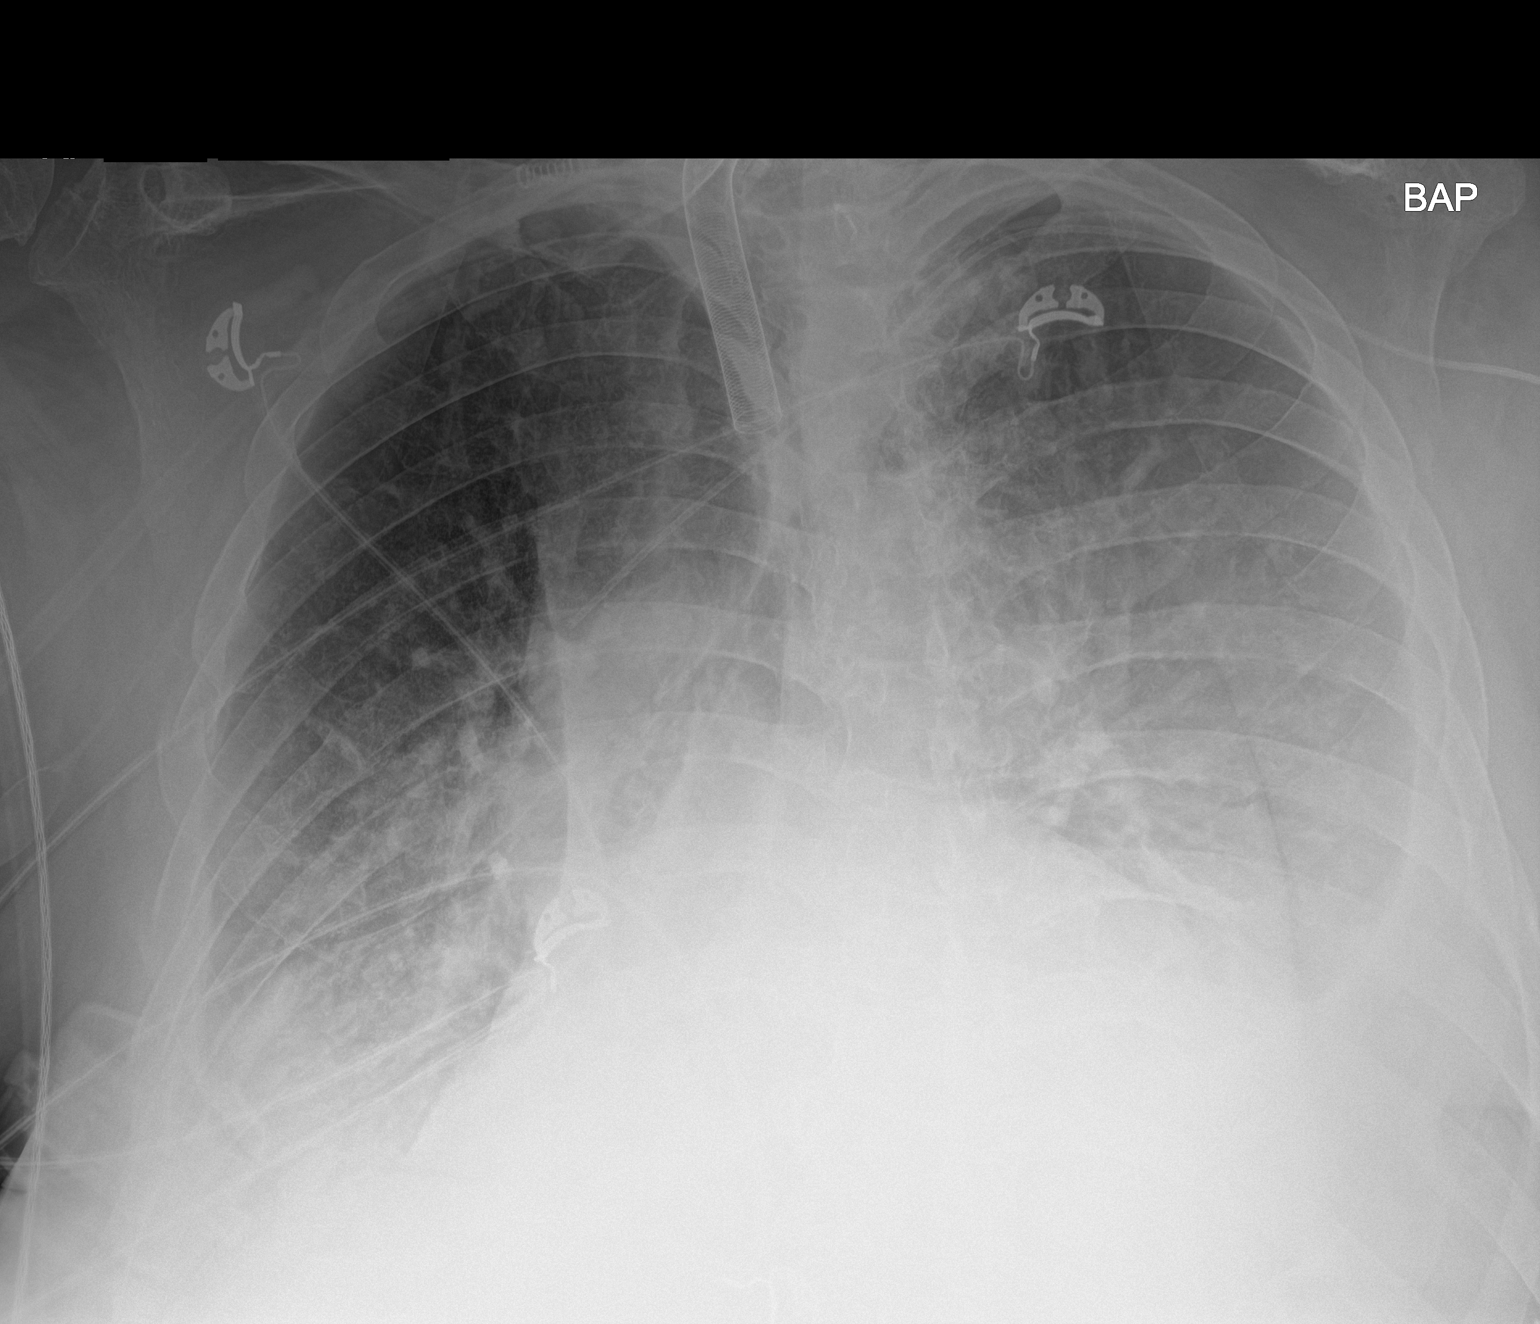

[2 of 2 positions shown; findings below may reference images not displayed]

FINDINGS: Tracheostomy tube and LEFT arm PICC line unchanged.

Enlargement of cardiac silhouette with vascular congestion.

BILATERAL pulmonary infiltrates question pulmonary edema.

Bibasilar pleural effusions and atelectasis.

No pneumothorax.

Old fracture of the posterior RIGHT seventh rib.

Skin fold projects over LEFT chest.
IMPRESSION: Persistent probable pulmonary edema/CHF with bibasilar effusions and
atelectasis.

No significant interval change.
